# Patient Record
Sex: Male | Born: 1960
Health system: Southern US, Community
[De-identification: ages and names within clinical notes are randomized; demographics above are authoritative.]

## PROBLEM LIST (undated history)

## (undated) DIAGNOSIS — M1990 Unspecified osteoarthritis, unspecified site: Secondary | ICD-10-CM

## (undated) DIAGNOSIS — G4730 Sleep apnea, unspecified: Secondary | ICD-10-CM

## (undated) DIAGNOSIS — Z9889 Other specified postprocedural states: Secondary | ICD-10-CM

## (undated) DIAGNOSIS — D649 Anemia, unspecified: Secondary | ICD-10-CM

## (undated) DIAGNOSIS — R112 Nausea with vomiting, unspecified: Secondary | ICD-10-CM

## (undated) HISTORY — PX: SEPTOPLASTY: SUR1290

## (undated) HISTORY — PX: BACK SURGERY: SHX140

## (undated) HISTORY — PX: TONSILLECTOMY: SUR1361

## (undated) HISTORY — PX: JOINT REPLACEMENT: SHX530

## (undated) HISTORY — PX: OTHER SURGICAL HISTORY: SHX169

---

## 2008-06-25 DIAGNOSIS — E559 Vitamin D deficiency, unspecified: Secondary | ICD-10-CM | POA: Insufficient documentation

## 2008-06-25 DIAGNOSIS — J209 Acute bronchitis, unspecified: Secondary | ICD-10-CM | POA: Insufficient documentation

## 2009-11-26 DIAGNOSIS — L255 Unspecified contact dermatitis due to plants, except food: Secondary | ICD-10-CM | POA: Insufficient documentation

## 2009-12-09 ENCOUNTER — Emergency Department: Payer: Self-pay | Admitting: Emergency Medicine

## 2011-06-12 ENCOUNTER — Encounter (HOSPITAL_COMMUNITY): Payer: Self-pay | Admitting: Pharmacy Technician

## 2011-06-16 ENCOUNTER — Encounter (HOSPITAL_COMMUNITY)
Admission: RE | Admit: 2011-06-16 | Discharge: 2011-06-16 | Disposition: A | Discharge status: Home / Self Care | Payer: BC Managed Care – PPO | Source: Ambulatory Visit | Attending: Orthopedic Surgery | Admitting: Orthopedic Surgery

## 2011-06-16 ENCOUNTER — Encounter (HOSPITAL_COMMUNITY): Payer: Self-pay

## 2011-06-16 HISTORY — DX: Other specified postprocedural states: Z98.890

## 2011-06-16 HISTORY — DX: Nausea with vomiting, unspecified: R11.2

## 2011-06-16 HISTORY — DX: Unspecified osteoarthritis, unspecified site: M19.90

## 2011-06-16 LAB — CBC
MCH: 32.1 pg (ref 26.0–34.0)
MCV: 93.2 fL (ref 78.0–100.0)
Platelets: 208 10*3/uL (ref 150–400)
RDW: 13.5 % (ref 11.5–15.5)

## 2011-06-16 LAB — URINALYSIS, ROUTINE W REFLEX MICROSCOPIC
Bilirubin Urine: NEGATIVE
Hgb urine dipstick: NEGATIVE
Protein, ur: NEGATIVE mg/dL
Specific Gravity, Urine: 1.028 (ref 1.005–1.030)
Urobilinogen, UA: 0.2 mg/dL (ref 0.0–1.0)

## 2011-06-16 LAB — DIFFERENTIAL
Basophils Absolute: 0 10*3/uL (ref 0.0–0.1)
Eosinophils Absolute: 0.2 10*3/uL (ref 0.0–0.7)
Eosinophils Relative: 2 % (ref 0–5)

## 2011-06-16 LAB — BASIC METABOLIC PANEL
CO2: 24 mEq/L (ref 19–32)
Calcium: 9.9 mg/dL (ref 8.4–10.5)
Creatinine, Ser: 0.93 mg/dL (ref 0.50–1.35)
Glucose, Bld: 111 mg/dL — ABNORMAL HIGH (ref 70–99)

## 2011-06-16 LAB — PROTIME-INR: Prothrombin Time: 12.6 seconds (ref 11.6–15.2)

## 2011-06-16 LAB — SURGICAL PCR SCREEN: Staphylococcus aureus: NEGATIVE

## 2011-06-16 NOTE — Pre-Procedure Instructions (Signed)
Talked to Presley Raddle, RTR at Dr. Nilsa Nutting office to inform him he needs to look at pt's BMET results in EPIC.

## 2011-06-16 NOTE — Patient Instructions (Signed)
20 Walter Kim  06/16/2011   Your procedure is scheduled on:  Tuesday 06/23/2011 at 1200pm  Report to [facility] at 1000 AM.  Call this number if you have problems the morning of surgery: [phone]   Remember:   Do not eat food:After Midnight.  May have clear liquids:until Midnight .  Clear liquids include soda, tea, black coffee, apple or grape juice, broth.  Take these medicines the morning of surgery with A SIP OF WATER: NONE   Do not wear jewelry, make-up or nail polish.  Do not wear lotions, powders, or perfumes.  Do not shave 48 hours prior to surgery.(women only-shaving legs)  Do not bring valuables to the hospital.  Contacts, dentures or bridgework may not be worn into surgery.  Leave suitcase in the car. After surgery it may be brought to your room.  For patients admitted to the hospital, checkout time is 11:00 AM the day of discharge.   Patients discharged the day of surgery will not be allowed to drive home.  Name and phone number of your driver:   Special Instructions: CHG Shower Use Special Wash: 1/2 bottle night before surgery and 1/2 bottle morning of surgery.   Please read over the following fact sheets that you were given: MRSA Information

## 2011-06-21 NOTE — H&P (Signed)
Walter Kim is an 51 y.o. male.    Chief Complaint: left hip OA and pain   HPI: Pt is a 51 y.o. male complaining of left hip pain for over 1.5 years. Pain had continually increased since the beginning.  Originally thought the pain was from the knees, which have arthritis, but eventually the left hip was examined and determined to be a cause of most of his pain. X-rays in the clinic show end-stage arthritic changes of the left hip. Pt has tried various conservative treatments which have failed to alleviate their symptoms, including an 22222intraarticular injection. Various options are discussed with the patient. Risks, benefits and expectations were discussed with the patient. Patient understand the risks, benefits and expectations and wishes to proceed with surgery.   PCP:  No primary provider on file.  D/C Plans:  Home with HHPT  Post-op Meds:   Rx given for ASA, Robaxin, Celebrex, Iron, Colace and MiraLax  Tranexamic Acid:  To be given  Decadron:  To be given   PMH: Past Medical History  Diagnosis Date  . PONV (postoperative nausea and vomiting)     after back surgery  . Arthritis     PSH: Past Surgical History  Procedure Date  . Back surgery     20 yrs. ago  . Tonsillectomy     as child  . Right knee arthroscopy 4 yrs. ago    Social History:  does not have a smoking history on file. He does not have any smokeless tobacco history on file. He reports that he drinks about 1.2 ounces of alcohol per week. He reports that he does not use illicit drugs.  Allergies:  No Known Allergies  Medications: No current facility-administered medications for this encounter.   Current Outpatient Prescriptions  Medication Sig Dispense Refill  . cholecalciferol (VITAMIN D) 1000 UNITS tablet Take 1,000 Units by mouth once a week.      Marland Kitchen ibuprofen (ADVIL,MOTRIN) 200 MG tablet Take 800 mg by mouth every 6 (six) hours as needed. Pain         ROS: Review of Systems  Constitutional:  Negative.   HENT: Negative.   Eyes: Negative.   Respiratory: Negative.   Cardiovascular: Negative.   Gastrointestinal: Negative.   Genitourinary: Negative.   Musculoskeletal: Positive for myalgias, back pain and joint pain.  Skin: Negative.   Neurological: Negative.   Endo/Heme/Allergies: Negative.   Psychiatric/Behavioral: Negative.      Physican Exam: Physical Exam  Constitutional: He is oriented to person, place, and time and well-developed, well-nourished, and in no distress.  HENT:  Head: Normocephalic and atraumatic.  Nose: Nose normal.  Mouth/Throat: Oropharynx is clear and moist.  Eyes: Pupils are equal, round, and reactive to light.  Neck: Neck supple. No JVD present. No tracheal deviation present. No thyromegaly present.  Cardiovascular: Normal rate, regular rhythm, normal heart sounds and intact distal pulses.   Pulmonary/Chest: Effort normal and breath sounds normal. No stridor.  Abdominal: Soft. There is no tenderness. There is no guarding.  Musculoskeletal:       Left hip: He exhibits decreased range of motion (all ROM causes pain), decreased strength, tenderness and bony tenderness. He exhibits no swelling, no crepitus, no deformity and no laceration.  Lymphadenopathy:    He has no cervical adenopathy.  Neurological: He is alert and oriented to person, place, and time.  Skin: Skin is warm and dry.  Psychiatric: Affect normal.     Assessment/Plan Assessment: left hip OA and pain  Plan: Patient will undergo a left total hip arthroplasty, anterior approach on 06/23/2011. Risks benefits and expectation were discussed with the patient. Patient understand risks, benefits and expectation and wishes to proceed.   Anastasio Auerbach Shelby Anderle   PAC  06/21/2011, 9:04 PM

## 2011-06-23 ENCOUNTER — Encounter (HOSPITAL_COMMUNITY)
Admission: RE | Disposition: A | Discharge status: Home Health | Payer: Self-pay | Source: Ambulatory Visit | Attending: Orthopedic Surgery

## 2011-06-23 ENCOUNTER — Encounter (HOSPITAL_COMMUNITY): Payer: Self-pay | Admitting: Anesthesiology

## 2011-06-23 ENCOUNTER — Inpatient Hospital Stay (HOSPITAL_COMMUNITY): Payer: BC Managed Care – PPO

## 2011-06-23 ENCOUNTER — Encounter (HOSPITAL_COMMUNITY): Payer: Self-pay | Admitting: *Deleted

## 2011-06-23 ENCOUNTER — Encounter (HOSPITAL_COMMUNITY): Payer: Self-pay

## 2011-06-23 ENCOUNTER — Inpatient Hospital Stay (HOSPITAL_COMMUNITY): Payer: BC Managed Care – PPO | Admitting: Anesthesiology

## 2011-06-23 ENCOUNTER — Inpatient Hospital Stay (HOSPITAL_COMMUNITY)
Admission: RE | Admit: 2011-06-23 | Discharge: 2011-06-25 | DRG: 818 | Disposition: A | Discharge status: Home Health | Payer: BC Managed Care – PPO | Source: Ambulatory Visit | Attending: Orthopedic Surgery | Admitting: Orthopedic Surgery

## 2011-06-23 DIAGNOSIS — Z96649 Presence of unspecified artificial hip joint: Secondary | ICD-10-CM

## 2011-06-23 DIAGNOSIS — M169 Osteoarthritis of hip, unspecified: Principal | ICD-10-CM | POA: Diagnosis present

## 2011-06-23 DIAGNOSIS — M1610 Unilateral primary osteoarthritis, unspecified hip: Principal | ICD-10-CM | POA: Diagnosis present

## 2011-06-23 DIAGNOSIS — Z01812 Encounter for preprocedural laboratory examination: Secondary | ICD-10-CM

## 2011-06-23 DIAGNOSIS — R112 Nausea with vomiting, unspecified: Secondary | ICD-10-CM | POA: Diagnosis not present

## 2011-06-23 HISTORY — PX: TOTAL HIP ARTHROPLASTY: SHX124

## 2011-06-23 LAB — ABO/RH: ABO/RH(D): O POS

## 2011-06-23 LAB — TYPE AND SCREEN
ABO/RH(D): O POS
Antibody Screen: NEGATIVE

## 2011-06-23 SURGERY — ARTHROPLASTY, HIP, TOTAL, ANTERIOR APPROACH
Anesthesia: General | Site: Hip | Laterality: Left | Wound class: Clean

## 2011-06-23 MED ORDER — HYDROMORPHONE HCL PF 1 MG/ML IJ SOLN
INTRAMUSCULAR | Status: DC | PRN
  Administered 2011-06-23 (×3): 0.5 mg via INTRAVENOUS

## 2011-06-23 MED ORDER — LIDOCAINE HCL (CARDIAC) 20 MG/ML IV SOLN
INTRAVENOUS | Status: DC | PRN
  Administered 2011-06-23: 80 mg via INTRAVENOUS

## 2011-06-23 MED ORDER — METOCLOPRAMIDE HCL 5 MG PO TABS
5.0000 mg | ORAL_TABLET | Freq: Three times a day (TID) | ORAL | Status: DC | PRN
  Filled 2011-06-23: qty 2

## 2011-06-23 MED ORDER — ONDANSETRON HCL 4 MG/2ML IJ SOLN
4.0000 mg | Freq: Four times a day (QID) | INTRAMUSCULAR | Status: DC | PRN
  Administered 2011-06-23 – 2011-06-24 (×3): 4 mg via INTRAVENOUS
  Filled 2011-06-23 (×3): qty 2

## 2011-06-23 MED ORDER — MENTHOL 3 MG MT LOZG
1.0000 | LOZENGE | OROMUCOSAL | Status: DC | PRN
  Filled 2011-06-23: qty 9

## 2011-06-23 MED ORDER — DOCUSATE SODIUM 100 MG PO CAPS
100.0000 mg | ORAL_CAPSULE | Freq: Two times a day (BID) | ORAL | Status: DC
  Administered 2011-06-23 – 2011-06-24 (×3): 100 mg via ORAL
  Filled 2011-06-23 (×4): qty 1

## 2011-06-23 MED ORDER — GLYCOPYRROLATE 0.2 MG/ML IJ SOLN
INTRAMUSCULAR | Status: DC | PRN
  Administered 2011-06-23: .8 mg via INTRAVENOUS

## 2011-06-23 MED ORDER — METHOCARBAMOL 500 MG PO TABS
500.0000 mg | ORAL_TABLET | Freq: Four times a day (QID) | ORAL | Status: DC | PRN
  Administered 2011-06-24 – 2011-06-25 (×3): 500 mg via ORAL
  Filled 2011-06-23 (×3): qty 1

## 2011-06-23 MED ORDER — MIDAZOLAM HCL 5 MG/5ML IJ SOLN
INTRAMUSCULAR | Status: DC | PRN
  Administered 2011-06-23: 2 mg via INTRAVENOUS

## 2011-06-23 MED ORDER — ZOLPIDEM TARTRATE 5 MG PO TABS
5.0000 mg | ORAL_TABLET | Freq: Every evening | ORAL | Status: DC | PRN
  Administered 2011-06-24: 5 mg via ORAL
  Filled 2011-06-23 (×2): qty 1

## 2011-06-23 MED ORDER — LACTATED RINGERS IV SOLN: INTRAVENOUS | Status: DC

## 2011-06-23 MED ORDER — CEFAZOLIN SODIUM-DEXTROSE 2-3 GM-% IV SOLR
2.0000 g | Freq: Four times a day (QID) | INTRAVENOUS | Status: AC
  Administered 2011-06-23 – 2011-06-24 (×3): 2 g via INTRAVENOUS
  Filled 2011-06-23 (×3): qty 50

## 2011-06-23 MED ORDER — PHENOL 1.4 % MT LIQD
1.0000 | OROMUCOSAL | Status: DC | PRN
  Filled 2011-06-23: qty 177

## 2011-06-23 MED ORDER — DEXAMETHASONE SODIUM PHOSPHATE 10 MG/ML IJ SOLN: 10.0000 mg | Freq: Once | INTRAMUSCULAR | Status: DC

## 2011-06-23 MED ORDER — FENTANYL CITRATE 0.05 MG/ML IJ SOLN
INTRAMUSCULAR | Status: DC | PRN
  Administered 2011-06-23 (×5): 50 ug via INTRAVENOUS

## 2011-06-23 MED ORDER — NEOSTIGMINE METHYLSULFATE 1 MG/ML IJ SOLN
INTRAMUSCULAR | Status: DC | PRN
  Administered 2011-06-23: 5 mg via INTRAVENOUS

## 2011-06-23 MED ORDER — HYDROMORPHONE HCL PF 1 MG/ML IJ SOLN
0.5000 mg | INTRAMUSCULAR | Status: DC | PRN
  Administered 2011-06-23: 1 mg via INTRAVENOUS
  Filled 2011-06-23 (×2): qty 1

## 2011-06-23 MED ORDER — ROCURONIUM BROMIDE 100 MG/10ML IV SOLN
INTRAVENOUS | Status: DC | PRN
  Administered 2011-06-23: 5 mg via INTRAVENOUS
  Administered 2011-06-23: 20 mg via INTRAVENOUS
  Administered 2011-06-23: 40 mg via INTRAVENOUS

## 2011-06-23 MED ORDER — ONDANSETRON HCL 4 MG/2ML IJ SOLN
INTRAMUSCULAR | Status: DC | PRN
  Administered 2011-06-23: 4 mg via INTRAVENOUS

## 2011-06-23 MED ORDER — ACETAMINOPHEN 10 MG/ML IV SOLN
INTRAVENOUS | Status: DC | PRN
  Administered 2011-06-23: 1000 mg via INTRAVENOUS

## 2011-06-23 MED ORDER — LACTATED RINGERS IV SOLN
INTRAVENOUS | Status: DC
  Administered 2011-06-23: 1000 mL via INTRAVENOUS

## 2011-06-23 MED ORDER — 0.9 % SODIUM CHLORIDE (POUR BTL) OPTIME
TOPICAL | Status: DC | PRN
  Administered 2011-06-23: 1000 mL

## 2011-06-23 MED ORDER — HYDROMORPHONE HCL PF 1 MG/ML IJ SOLN
INTRAMUSCULAR | Status: AC
  Administered 2011-06-23: 1 mg via INTRAVENOUS
  Filled 2011-06-23: qty 1

## 2011-06-23 MED ORDER — ONDANSETRON HCL 4 MG PO TABS: 4.0000 mg | ORAL_TABLET | Freq: Four times a day (QID) | ORAL | Status: DC | PRN

## 2011-06-23 MED ORDER — FERROUS SULFATE 325 (65 FE) MG PO TABS
325.0000 mg | ORAL_TABLET | Freq: Three times a day (TID) | ORAL | Status: DC
  Administered 2011-06-23 – 2011-06-24 (×4): 325 mg via ORAL
  Filled 2011-06-23 (×5): qty 1

## 2011-06-23 MED ORDER — ALUM & MAG HYDROXIDE-SIMETH 200-200-20 MG/5ML PO SUSP: 30.0000 mL | ORAL | Status: DC | PRN

## 2011-06-23 MED ORDER — PROPOFOL 10 MG/ML IV EMUL
INTRAVENOUS | Status: DC | PRN
  Administered 2011-06-23: 200 mg via INTRAVENOUS

## 2011-06-23 MED ORDER — TRANEXAMIC ACID 100 MG/ML IV SOLN
15.0000 mg/kg | Freq: Once | INTRAVENOUS | Status: AC
  Administered 2011-06-23: 1102.5 mg via INTRAVENOUS
  Filled 2011-06-23: qty 11.03

## 2011-06-23 MED ORDER — DIPHENHYDRAMINE HCL 25 MG PO CAPS: 25.0000 mg | ORAL_CAPSULE | Freq: Four times a day (QID) | ORAL | Status: DC | PRN

## 2011-06-23 MED ORDER — METOCLOPRAMIDE HCL 5 MG/ML IJ SOLN
5.0000 mg | Freq: Three times a day (TID) | INTRAMUSCULAR | Status: DC | PRN
  Administered 2011-06-24: 10 mg via INTRAVENOUS
  Filled 2011-06-23: qty 2

## 2011-06-23 MED ORDER — DEXAMETHASONE SODIUM PHOSPHATE 10 MG/ML IJ SOLN
INTRAMUSCULAR | Status: DC | PRN
  Administered 2011-06-23: 10 mg via INTRAVENOUS

## 2011-06-23 MED ORDER — RIVAROXABAN 10 MG PO TABS
10.0000 mg | ORAL_TABLET | ORAL | Status: DC
  Administered 2011-06-24 – 2011-06-25 (×2): 10 mg via ORAL
  Filled 2011-06-23 (×2): qty 1

## 2011-06-23 MED ORDER — DEXAMETHASONE SODIUM PHOSPHATE 10 MG/ML IJ SOLN
10.0000 mg | Freq: Once | INTRAMUSCULAR | Status: AC
  Administered 2011-06-24: 10 mg via INTRAVENOUS
  Filled 2011-06-23: qty 1

## 2011-06-23 MED ORDER — HYDROMORPHONE HCL PF 1 MG/ML IJ SOLN
0.2500 mg | INTRAMUSCULAR | Status: DC | PRN
Start: 2011-06-23 — End: 2011-06-23
  Administered 2011-06-23 (×4): 0.5 mg via INTRAVENOUS

## 2011-06-23 MED ORDER — BISACODYL 5 MG PO TBEC: 5.0000 mg | DELAYED_RELEASE_TABLET | Freq: Every day | ORAL | Status: DC | PRN

## 2011-06-23 MED ORDER — POLYETHYLENE GLYCOL 3350 17 G PO PACK
17.0000 g | PACK | Freq: Two times a day (BID) | ORAL | Status: DC
  Administered 2011-06-23 – 2011-06-24 (×3): 17 g via ORAL
  Filled 2011-06-23 (×3): qty 1

## 2011-06-23 MED ORDER — HYDROCODONE-ACETAMINOPHEN 5-325 MG PO TABS
1.5000 | ORAL_TABLET | ORAL | Status: DC
  Administered 2011-06-23: 3 via ORAL
  Administered 2011-06-23: 2 via ORAL
  Filled 2011-06-23: qty 3
  Filled 2011-06-23: qty 2

## 2011-06-23 MED ORDER — SODIUM CHLORIDE 0.9 % IV SOLN
100.0000 mL/h | INTRAVENOUS | Status: DC
  Administered 2011-06-23 – 2011-06-24 (×3): 100 mL/h via INTRAVENOUS
  Filled 2011-06-23 (×10): qty 1000

## 2011-06-23 MED ORDER — METHOCARBAMOL 100 MG/ML IJ SOLN
500.0000 mg | Freq: Four times a day (QID) | INTRAVENOUS | Status: DC | PRN
  Administered 2011-06-23: 500 mg via INTRAVENOUS
  Filled 2011-06-23: qty 5

## 2011-06-23 MED ORDER — FLEET ENEMA 7-19 GM/118ML RE ENEM: 1.0000 | ENEMA | Freq: Once | RECTAL | Status: AC | PRN

## 2011-06-23 MED ORDER — CEFAZOLIN SODIUM 1-5 GM-% IV SOLN
1.0000 g | INTRAVENOUS | Status: AC
  Administered 2011-06-23 (×2): 1 g via INTRAVENOUS

## 2011-06-23 SURGICAL SUPPLY — 39 items
BAG ZIPLOCK 12X15 (MISCELLANEOUS) ×2 IMPLANT
BLADE SAW SGTL 18X1.27X75 (BLADE) ×2 IMPLANT
CELLS DAT CNTRL 66122 CELL SVR (MISCELLANEOUS) IMPLANT
CLOTH BEACON ORANGE TIMEOUT ST (SAFETY) ×2 IMPLANT
DERMABOND ADVANCED (GAUZE/BANDAGES/DRESSINGS) ×1
DERMABOND ADVANCED .7 DNX12 (GAUZE/BANDAGES/DRESSINGS) ×1 IMPLANT
DRAPE C-ARM 42X72 X-RAY (DRAPES) ×2 IMPLANT
DRAPE STERI IOBAN 125X83 (DRAPES) ×2 IMPLANT
DRAPE U-SHAPE 47X51 STRL (DRAPES) ×6 IMPLANT
DRSG AQUACEL AG ADV 3.5X10 (GAUZE/BANDAGES/DRESSINGS) ×2 IMPLANT
DRSG TEGADERM 4X4.75 (GAUZE/BANDAGES/DRESSINGS) ×2 IMPLANT
DURAPREP 26ML APPLICATOR (WOUND CARE) ×2 IMPLANT
ELECT BLADE TIP CTD 4 INCH (ELECTRODE) ×2 IMPLANT
ELECT REM PT RETURN 9FT ADLT (ELECTROSURGICAL) ×2
ELECTRODE REM PT RTRN 9FT ADLT (ELECTROSURGICAL) ×1 IMPLANT
EVACUATOR 1/8 PVC DRAIN (DRAIN) ×2 IMPLANT
FACESHIELD LNG OPTICON STERILE (SAFETY) ×8 IMPLANT
GAUZE SPONGE 2X2 8PLY STRL LF (GAUZE/BANDAGES/DRESSINGS) ×1 IMPLANT
GLOVE BIOGEL PI IND STRL 7.5 (GLOVE) ×1 IMPLANT
GLOVE BIOGEL PI IND STRL 8 (GLOVE) ×1 IMPLANT
GLOVE BIOGEL PI INDICATOR 7.5 (GLOVE) ×1
GLOVE BIOGEL PI INDICATOR 8 (GLOVE) ×1
GLOVE ECLIPSE 8.0 STRL XLNG CF (GLOVE) ×2 IMPLANT
GLOVE ORTHO TXT STRL SZ7.5 (GLOVE) ×4 IMPLANT
GOWN BRE IMP PREV XXLGXLNG (GOWN DISPOSABLE) ×2 IMPLANT
GOWN STRL NON-REIN LRG LVL3 (GOWN DISPOSABLE) ×2 IMPLANT
KIT BASIN OR (CUSTOM PROCEDURE TRAY) ×2 IMPLANT
PACK TOTAL JOINT (CUSTOM PROCEDURE TRAY) ×2 IMPLANT
PADDING CAST COTTON 6X4 STRL (CAST SUPPLIES) ×2 IMPLANT
RTRCTR WOUND ALEXIS 18CM MED (MISCELLANEOUS)
SPONGE GAUZE 2X2 STER 10/PKG (GAUZE/BANDAGES/DRESSINGS) ×1
SUCTION FRAZIER 12FR DISP (SUCTIONS) ×2 IMPLANT
SUT MNCRL AB 4-0 PS2 18 (SUTURE) ×2 IMPLANT
SUT VIC AB 1 CT1 36 (SUTURE) ×6 IMPLANT
SUT VIC AB 2-0 CT1 27 (SUTURE) ×2
SUT VIC AB 2-0 CT1 TAPERPNT 27 (SUTURE) ×2 IMPLANT
SUT VLOC 180 0 24IN GS25 (SUTURE) ×2 IMPLANT
TOWEL OR 17X26 10 PK STRL BLUE (TOWEL DISPOSABLE) ×4 IMPLANT
TRAY FOLEY CATH 14FRSI W/METER (CATHETERS) ×2 IMPLANT

## 2011-06-23 NOTE — Progress Notes (Signed)
Portable ap pelvis and lateral left hip x-rays done 

## 2011-06-23 NOTE — Anesthesia Postprocedure Evaluation (Signed)
  Anesthesia Post-op Note  Patient: Walter Kim  Procedure(s) Performed: Procedure(s) (LRB): TOTAL HIP ARTHROPLASTY ANTERIOR APPROACH (Left)  Patient Location: PACU  Anesthesia Type: General  Level of Consciousness: awake and alert   Airway and Oxygen Therapy: Patient Spontanous Breathing  Post-op Pain: mild  Post-op Assessment: Post-op Vital signs reviewed, Patient's Cardiovascular Status Stable, Respiratory Function Stable, Patent Airway and No signs of Nausea or vomiting  Post-op Vital Signs: stable  Complications: No apparent anesthesia complications

## 2011-06-23 NOTE — Anesthesia Preprocedure Evaluation (Addendum)
Anesthesia Evaluation  Patient identified by MRN, date of birth, ID band Patient awake    Reviewed: Allergy & Precautions, H&P , NPO status , Patient's Chart, lab work & pertinent test results  History of Anesthesia Complications (+) PONV and DIFFICULT AIRWAY  Airway Mallampati: II TM Distance: >3 FB Neck ROM: full    Dental No notable dental hx.    Pulmonary neg pulmonary ROS,  breath sounds clear to auscultation  Pulmonary exam normal       Cardiovascular Exercise Tolerance: Good negative cardio ROS  Rhythm:regular Rate:Normal     Neuro/Psych negative neurological ROS  negative psych ROS   GI/Hepatic negative GI ROS, Neg liver ROS,   Endo/Other  negative endocrine ROS  Renal/GU negative Renal ROS  negative genitourinary   Musculoskeletal   Abdominal   Peds  Hematology negative hematology ROS (+)   Anesthesia Other Findings   Reproductive/Obstetrics negative OB ROS                          Anesthesia Physical Anesthesia Plan  ASA: I  Anesthesia Plan: General   Post-op Pain Management:    Induction: Intravenous  Airway Management Planned: Oral ETT  Additional Equipment:   Intra-op Plan:   Post-operative Plan:   Informed Consent: I have reviewed the patients History and Physical, chart, labs and discussed the procedure including the risks, benefits and alternatives for the proposed anesthesia with the patient or authorized representative who has indicated his/her understanding and acceptance.   Dental Advisory Given  Plan Discussed with: CRNA and Surgeon  Anesthesia Plan Comments:         Anesthesia Quick Evaluation

## 2011-06-23 NOTE — Progress Notes (Signed)
X-ray results noted after talking with x-ray department.

## 2011-06-23 NOTE — Interval H&P Note (Signed)
History and Physical Interval Note:  06/23/2011 10:13 AM  Walter Kim  has presented today for surgery, with the diagnosis of left hip osteoarthritis   The various methods of treatment have been discussed with the patient and family. After consideration of risks, benefits and other options for treatment, the patient has consented to  Procedure(s) (LRB): LEFT TOTAL HIP ARTHROPLASTY ANTERIOR APPROACH (Left) as a surgical intervention .  The patients' history has been reviewed, patient examined, no change in status, stable for surgery.  I have reviewed the patients' chart and labs.  Questions were answered to the patient's satisfaction.     [name]

## 2011-06-23 NOTE — Preoperative (Signed)
Beta Blockers   Reason not to administer Beta Blockers:Not Applicable 

## 2011-06-23 NOTE — Transfer of Care (Signed)
Immediate Anesthesia Transfer of Care Note  Patient: Walter Kim  Procedure(s) Performed: Procedure(s) (LRB): TOTAL HIP ARTHROPLASTY ANTERIOR APPROACH (Left)  Patient Location: PACU  Anesthesia Type: General  Level of Consciousness: awake, alert , oriented and patient cooperative  Airway & Oxygen Therapy: Patient Spontanous Breathing and Patient connected to face mask oxygen  Post-op Assessment: Report given to PACU RN, Post -op Vital signs reviewed and stable and Patient moving all extremities  Post vital signs: Reviewed and stable  Complications: No apparent anesthesia complications

## 2011-06-23 NOTE — Op Note (Signed)
NAME:  Walter Kim                ACCOUNT NO.: [id number]      MEDICAL RECORD NO.: [id number]      FACILITY:  [facility]      PHYSICIAN:  Durene Romans D  DATE OF BIRTH:  October 09, 1960     DATE OF PROCEDURE:  06/23/2011                                 OPERATIVE REPORT         PREOPERATIVE DIAGNOSIS: Left  hip osteoarthritis.      POSTOPERATIVE DIAGNOSIS:  Left hip osteoarthritis.      PROCEDURE:  Left total hip replacement through an anterior approach   utilizing DePuy THR system, component size 54mm pinnacle cup, a size 36+4 neutral   Altrex liner, a size 6Hi Tri Lock stem with a 36+5 delta ceramic   ball.      SURGEON:  Madlyn Frankel. Charlann Boxer, M.D.      ASSISTANT:  Lanney Gins, PA      ANESTHESIA:  General.      SPECIMENS:  None.      COMPLICATIONS:  None.      BLOOD LOSS:  600 cc     DRAINS:  One Hemovac.      INDICATION OF THE PROCEDURE:  Walter Kim is a 51 y.o. male who had   presented to office for evaluation of left hip pain.  Radiographs revealed   progressive degenerative changes with bone-on-bone   articulation to the  hip joint.  The patient had painful limited range of   motion significantly affecting their overall quality of life.  The patient was failing to    respond to conservative measures, and at this point was ready   to proceed with more definitive measures.  The patient has noted progressive   degenerative changes in his hip, progressive problems and dysfunction   with regarding the hip prior to surgery.  Consent was obtained for   benefit of pain relief.  Specific risk of infection, DVT, component   failure, dislocation, need for revision surgery, as well discussion of   the anterior versus posterior approach were reviewed.  Consent was   obtained for benefit of anterior pain relief through an anterior   approach.      PROCEDURE IN DETAIL:  The patient was brought to operative theater.   Once adequate anesthesia, preoperative antibiotics, 2gm Ancef  administered.   The patient was positioned supine on the OSI Hanna table.  Once adequate   padding of boney process was carried out, we had predraped out the hip, and  used fluoroscopy to confirm orientation of the pelvis and position.      The right hip was then prepped and draped from proximal iliac crest to   mid thigh with shower curtain technique.      Time-out was performed identifying the patient, planned procedure, and   extremity.     An incision was then made 2 cm distal and lateral to the   anterior superior iliac spine extending over the orientation of the   tensor fascia lata muscle and sharp dissection was carried down to the   fascia of the muscle and protractor placed in the soft tissues.      The fascia was then incised.  The muscle belly was identified and swept   laterally  and retractor placed along the superior neck.  Following   cauterization of the circumflex vessels and removing some pericapsular   fat, a second cobra retractor was placed on the inferior neck.  A third   retractor was placed on the anterior acetabulum after elevating the   anterior rectus.  A L-capsulotomy was along the line of the   superior neck to the trochanteric fossa, then extended proximally and   distally.  Tag sutures were placed and the retractors were then placed   intracapsular.  We then identified the trochanteric fossa and   orientation of my neck cut, confirmed this radiographically   and then made a neck osteotomy with the femur on traction.  The femoral   head was removed without difficulty or complication.  Traction was let   off and retractors were placed posterior and anterior around the   acetabulum.      The labrum and foveal tissue were debrided.  I began reaming with a 47mm   reamer and reamed up to 53mm reamer with good bony bed preparation and a 54   cup was chosen.  The final 54mm Pinnacle cup was then impacted under fluoroscopy  to confirm the depth of penetration  and orientation with respect to   abduction.  A screw was placed followed by the hole eliminator.  The final   36+4 Altrex liner was impacted with good visualized rim fit.  The cup was positioned anatomically within the acetabular portion of the pelvis.      At this point, the femur was rolled at 80 degrees.  Further capsule was   released off the inferior aspect of the femoral neck.  I then   released the superior capsule proximally.  The hook was placed laterally   along the femur and elevated manually and held in position with the bed   hook.  The leg was then extended and adducted with the leg rolled to 100   degrees of external rotation.  Once the proximal femur was fully   exposed, I used a box osteotome to set orientation.  I then began   broaching with the starting chili pepper broach and passed this by hand and then broached up to 6.  With the 6 broach in place I chose a highoffset neck with a 36+1.5 and did a trial reduction.  The offset was appropriate, leg lengths   appeared to be equal, confirmed radiographically.   Given these findings, I went ahead and dislocated the hip, repositioned all   retractors and positioned the right hip in the extended and abducted position.  The final 6 Hi  Tri Lock stem was   chosen and it was impacted down to the level of neck cut.  Based on this   and a repeat trial reduction, a 36+5 delta ceramic ball was chosen and   impacted onto a clean and dry trunnion, and the hip was reduced.  The   hip had been irrigated throughout the case again at this point.  I did   reapproximate the superior capsular leaflet to the anterior leaflet   using #1 Vicryl, placed a medium Hemovac drain deep.  The fascia of the   tensor fascia lata muscle was then reapproximated using #1 Vicryl.  The   remaining wound was closed with 2-0 Vicryl and running 4-0 Monocryl.   The hip was cleaned, dried, and dressed sterilely using Dermabond and   Aquacel dressing.  Drain site  dressed separately.  She was then brought   to recovery room in stable condition tolerating the procedure well.    Danae Orleans, PA-C was present for the entirety of the case involved from   preoperative positioning, perioperative retractor management, general   facilitation of the case, as well as primary wound closure as assistant.            Pietro Cassis Alvan Dame, M.D.            MDO/MEDQ  D:  02/10/2011  T:  02/10/2011  Job:  [id number]      Electronically Signed by Paralee Cancel M.D. on 02/16/2011 09:15:38 AM

## 2011-06-24 LAB — BASIC METABOLIC PANEL
BUN: 15 mg/dL (ref 6–23)
GFR calc non Af Amer: >90 mL/min (ref >90)
Glucose, Bld: 141 mg/dL — ABNORMAL HIGH (ref 70–99)
Potassium: 4.6 mEq/L (ref 3.5–5.1)

## 2011-06-24 LAB — CBC
HCT: 39.6 % (ref 39.0–52.0)
Hemoglobin: 13.4 g/dL (ref 13.0–17.0)
MCHC: 33.8 g/dL (ref 30.0–36.0)

## 2011-06-24 MED ORDER — HYDROCODONE-ACETAMINOPHEN 5-325 MG PO TABS
1.5000 | ORAL_TABLET | ORAL | Status: DC
  Administered 2011-06-24: 2 via ORAL
  Administered 2011-06-24 (×2): 1 via ORAL
  Administered 2011-06-24 – 2011-06-25 (×4): 2 via ORAL
  Filled 2011-06-24: qty 2
  Filled 2011-06-24: qty 1
  Filled 2011-06-24: qty 2
  Filled 2011-06-24 (×2): qty 1
  Filled 2011-06-24 (×2): qty 2
  Filled 2011-06-24: qty 1

## 2011-06-24 MED ORDER — DSS 100 MG PO CAPS: 100.0000 mg | ORAL_CAPSULE | Freq: Two times a day (BID) | ORAL | Status: AC

## 2011-06-24 MED ORDER — ASPIRIN EC 325 MG PO TBEC: 325.0000 mg | DELAYED_RELEASE_TABLET | Freq: Two times a day (BID) | ORAL | Status: AC

## 2011-06-24 MED ORDER — FERROUS SULFATE 325 (65 FE) MG PO TABS: 325.0000 mg | ORAL_TABLET | Freq: Three times a day (TID) | ORAL | Status: DC

## 2011-06-24 MED ORDER — HYDROCODONE-ACETAMINOPHEN 5-325 MG PO TABS: 1.5000 | ORAL_TABLET | ORAL | Status: AC

## 2011-06-24 MED ORDER — METHOCARBAMOL 500 MG PO TABS: 500.0000 mg | ORAL_TABLET | Freq: Four times a day (QID) | ORAL | Status: AC | PRN

## 2011-06-24 MED ORDER — DIPHENHYDRAMINE HCL 25 MG PO CAPS: 25.0000 mg | ORAL_CAPSULE | Freq: Four times a day (QID) | ORAL | Status: DC | PRN

## 2011-06-24 MED ORDER — CELECOXIB 200 MG PO CAPS: 200.0000 mg | ORAL_CAPSULE | Freq: Two times a day (BID) | ORAL | Status: AC

## 2011-06-24 MED ORDER — POLYETHYLENE GLYCOL 3350 17 G PO PACK: 17.0000 g | PACK | Freq: Two times a day (BID) | ORAL | Status: AC

## 2011-06-24 NOTE — Progress Notes (Signed)
Subjective: 1 Day Post-Op Procedure(s) (LRB): TOTAL HIP ARTHROPLASTY ANTERIOR APPROACH (Left)   Patient reports pain as mild. Pain well controlled. No events throughout the night.  Objective:   VITALS:   Filed Vitals:   06/24/11 0500  BP: 124/82  Pulse: 63  Temp: 98.2 F (36.8 C)  Resp: 16    Neurovascular intact Dorsiflexion/Plantar flexion intact Incision: dressing C/D/I No cellulitis present Compartment soft  LABS  Basename 06/24/11 0451  HGB 13.4  HCT 39.6  WBC 15.0*  PLT 204     Basename 06/24/11 0451  NA 134*  K 4.6  BUN 15  CREATININE 0.96  GLUCOSE 141*     Assessment/Plan: 1 Day Post-Op Procedure(s) (LRB): TOTAL HIP ARTHROPLASTY ANTERIOR APPROACH (Left)   HV drain d/c'ed Foley cath d/c'ed Advance diet Up with therapy Plan for discharge tomorrow to home if continues to do well.   Anastasio Auerbach Ericah Scotto   PAC  06/24/2011, 8:37 AM

## 2011-06-24 NOTE — Progress Notes (Signed)
Physical Therapy Treatment Patient Details Name: Walter Kim MRN: [id number] DOB: May 02, 1960 Today's Date: 06/24/2011  PT Assessment/Plan  PT - Assessment/Plan Comments on Treatment Session: Pt limited by fatigue - will follow in pm. PT Plan: Discharge plan remains appropriate PT Frequency: 7X/week Recommendations for Other Services: OT consult Follow Up Recommendations: Home health PT Equipment Recommended: None recommended by PT PT Goals  Acute Rehab PT Goals PT Goal Formulation: With patient Time For Goal Achievement: 7 days Pt will go Supine/Side to Sit: with supervision PT Goal: Supine/Side to Sit - Progress: Goal set today Pt will go Sit to Supine/Side: with supervision PT Goal: Sit to Supine/Side - Progress: Goal set today Pt will go Sit to Stand: with supervision PT Goal: Sit to Stand - Progress: Goal set today Pt will go Stand to Sit: with supervision PT Goal: Stand to Sit - Progress: Goal set today Pt will Ambulate: 51 - 150 feet;with supervision;with rolling walker PT Goal: Ambulate - Progress: Goal set today  PT Treatment Precautions/Restrictions  Restrictions Weight Bearing Restrictions: No Other Position/Activity Restrictions: WBAT Mobility (including Balance) Bed Mobility Bed Mobility: Yes Supine to Sit: 3: Mod assist Supine to Sit Details (indicate cue type and reason): cues for sequence and use for use of UEs to self assist Sit to Supine: 3: Mod assist Sit to Supine - Details (indicate cue type and reason): cues for sequence with assist for LEs Transfers Transfers: Yes Sit to Stand: 1: +2 Total assist;With armrests;From chair/3-in-1;With upper extremity assist Sit to Stand Details (indicate cue type and reason): cues for LE position and use of UEs - pt 70% Stand to Sit: 1: +2 Total assist;To chair/3-in-1;With upper extremity assist Stand to Sit Details: cues for LE position and use of UEs - pt 70% Ambulation/Gait Ambulation/Gait: Yes Ambulation/Gait  Assistance: 1: +2 Total assist Ambulation/Gait Assistance Details (indicate cue type and reason): cues for posture and position from RW Ambulation Distance (Feet): 5 Feet Assistive device: Rolling walker Gait Pattern: Step-to pattern    Exercise  Total Joint Exercises Ankle Circles/Pumps: AROM;10 reps;Supine;Both Quad Sets: AROM;10 reps;Both;Supine Heel Slides: 10 reps;AAROM;Supine;Left Hip ABduction/ADduction: 10 reps;AAROM;Left;Supine End of Session PT - End of Session Equipment Utilized During Treatment: Gait belt Activity Tolerance: Patient limited by fatigue Patient left: in bed;with call bell in reach;with family/visitor present Nurse Communication: Mobility status for transfers;Mobility status for ambulation;Other (comment) General Behavior During Session: [facility] for tasks performed Cognition: [facility] for tasks performed  Walter Kim 06/24/2011, 12:40 PM

## 2011-06-24 NOTE — Plan of Care (Deleted)
Problem: Consults Goal: Diagnosis- Total Joint Replacement Outcome: Completed/Met Date Met:  06/24/11 Left Total Knee

## 2011-06-24 NOTE — Plan of Care (Signed)
Problem: Consults Goal: Diagnosis- Total Joint Replacement Outcome: Completed/Met Date Met:  06/24/11 Correction: left total hip (anterior)

## 2011-06-24 NOTE — Evaluation (Signed)
Physical Therapy Evaluation Patient Details Name: Walter Kim MRN: [id number] DOB: 1960/05/23 Today's Date: 06/24/2011  Problem List:  Patient Active Problem List  Diagnoses  . S/P left THA, AA    Past Medical History:  Past Medical History  Diagnosis Date  . PONV (postoperative nausea and vomiting)     after back surgery  . Arthritis    Past Surgical History:  Past Surgical History  Procedure Date  . Back surgery     20 yrs. ago  . Tonsillectomy     as child  . Right knee arthroscopy 4 yrs. ago    PT Assessment/Plan/Recommendation PT Assessment Clinical Impression Statement: Pt with L THR presents with decreased L LE strength/ROM , limited functional mobility and decreased BP with OOB activity. PT Recommendation/Assessment: Patient will need skilled PT in the acute care venue PT Problem List: Decreased strength;Decreased range of motion;Decreased activity tolerance;Decreased mobility;Decreased knowledge of use of DME;Pain;Obesity PT Therapy Diagnosis : Difficulty walking PT Plan PT Frequency: 7X/week PT Treatment/Interventions: DME instruction;Gait training;Stair training;Functional mobility training;Therapeutic activities;Therapeutic exercise;Patient/family education PT Recommendation Recommendations for Other Services: OT consult Follow Up Recommendations: Home health PT Equipment Recommended: None recommended by PT PT Goals  Acute Rehab PT Goals PT Goal Formulation: With patient Time For Goal Achievement: 7 days Pt will go Supine/Side to Sit: with supervision PT Goal: Supine/Side to Sit - Progress: Goal set today Pt will go Sit to Supine/Side: with supervision PT Goal: Sit to Supine/Side - Progress: Goal set today Pt will go Sit to Stand: with supervision PT Goal: Sit to Stand - Progress: Goal set today Pt will go Stand to Sit: with supervision PT Goal: Stand to Sit - Progress: Goal set today Pt will Ambulate: 51 - 150 feet;with supervision;with rolling  walker PT Goal: Ambulate - Progress: Goal set today  PT Evaluation Precautions/Restrictions  Restrictions Weight Bearing Restrictions: No Other Position/Activity Restrictions: WBAT Prior Functioning  Home Living Lives With: Spouse Receives Help From: Family Type of Home: House Home Layout: Multi-level Alternate Level Stairs-Rails: None (ELEVATOR) Alternate Level Stairs-Number of Steps: 0 (elevator) Home Access: Ramped entrance Home Adaptive Equipment: Walker - rolling Prior Function Level of Independence: Independent with basic ADLs;Independent with transfers;Independent with gait Able to Take Stairs?: Yes Driving: Yes Vocation: Full time employment Cognition Cognition Arousal/Alertness: Awake/alert Overall Cognitive Status: Appears within functional limits for tasks assessed Orientation Level: Oriented X4 Sensation/Coordination Coordination Gross Motor Movements are Fluid and Coordinated: Yes Extremity Assessment RUE Assessment RUE Assessment: Within Functional Limits LUE Assessment LUE Assessment: Within Functional Limits RLE Assessment RLE Assessment: Within Functional Limits LLE Assessment LLE Assessment: Exceptions to [facility] (hip flex to 70; abd 15 AAROM; hip strength 2/5) Mobility (including Balance) Bed Mobility Bed Mobility: Yes Supine to Sit: 3: Mod assist Supine to Sit Details (indicate cue type and reason): cues for sequence and use for use of UEs to self assist Transfers Transfers: Yes Sit to Stand: 1: +2 Total assist Sit to Stand Details (indicate cue type and reason): cues for LE position and use of UEs - pt 70% Stand to Sit: 1: +2 Total assist;With armrests;To chair/3-in-1 Stand to Sit Details: cues for LE postion and use of UEs Ambulation/Gait Ambulation/Gait: Yes Ambulation/Gait Assistance: 1: +2 Total assist Ambulation/Gait Assistance Details (indicate cue type and reason): cues for sequence, posture and position from RW - ltd by decreased  BP Ambulation Distance (Feet): 23 Feet Assistive device: Rolling walker Gait Pattern: Step-to pattern    Exercise  Total Joint Exercises Ankle Circles/Pumps: AROM;10  reps;Supine;Both Quad Sets: AROM;10 reps;Both;Supine Heel Slides: 10 reps;AAROM;Supine;Left Hip ABduction/ADduction: 10 reps;AAROM;Left;Supine End of Session PT - End of Session Equipment Utilized During Treatment: Gait belt Activity Tolerance: Treatment limited secondary to medical complications (Comment) Patient left: in chair;with call bell in reach;with family/visitor present Nurse Communication: Mobility status for transfers;Mobility status for ambulation;Other (comment) (BP standing 115/67; amb 82/47) General Behavior During Session: [facility] for tasks performed Cognition: [facility] for tasks performed  Deadra Diggins 06/24/2011, 12:37 PM

## 2011-06-24 NOTE — Progress Notes (Signed)
Physical Therapy Treatment Patient Details Name: Walter Kim MRN: [id number] DOB: 1960/05/11 Today's Date: 06/24/2011  PT Assessment/Plan  PT - Assessment/Plan Comments on Treatment Session: Marked improvement in activity tolerance this pm PT Plan: Discharge plan remains appropriate PT Frequency: 7X/week Follow Up Recommendations: Home health PT Equipment Recommended: None recommended by PT PT Goals  Acute Rehab PT Goals PT Goal Formulation: With patient Time For Goal Achievement: 7 days Pt will go Supine/Side to Sit: with supervision PT Goal: Supine/Side to Sit - Progress: Progressing toward goal Pt will go Sit to Supine/Side: with supervision PT Goal: Sit to Supine/Side - Progress: Progressing toward goal Pt will go Sit to Stand: with supervision PT Goal: Sit to Stand - Progress: Progressing toward goal Pt will go Stand to Sit: with supervision PT Goal: Stand to Sit - Progress: Progressing toward goal Pt will Ambulate: 51 - 150 feet;with supervision;with rolling walker PT Goal: Ambulate - Progress: Progressing toward goal  PT Treatment Precautions/Restrictions  Restrictions Weight Bearing Restrictions: No Other Position/Activity Restrictions: WBAT Mobility (including Balance) Bed Mobility Supine to Sit: 4: Min assist;3: Mod assist Supine to Sit Details (indicate cue type and reason): cues for sequence and use of R LE and UEs to self-assist Sit to Supine: 3: Mod assist Sit to Supine - Details (indicate cue type and reason): cues for sequence with assist for LEs Transfers Sit to Stand: 4: Min assist;From bed;With upper extremity assist Sit to Stand Details (indicate cue type and reason): cues for use of UEs and LE management Stand to Sit: 4: Min assist;With upper extremity assist;With armrests;To chair/3-in-1 Stand to Sit Details: cues for use of UEs and LE management Ambulation/Gait Ambulation/Gait Assistance: 4: Min assist Ambulation/Gait Assistance Details (indicate  cue type and reason): cues for posture and position from RW Ambulation Distance (Feet): 108 Feet Assistive device: Rolling walker Gait Pattern: Step-to pattern;Step-through pattern    Exercise    End of Session PT - End of Session Equipment Utilized During Treatment: Gait belt Activity Tolerance: Patient limited by fatigue Patient left: in chair;with call bell in reach;with family/visitor present Nurse Communication: Mobility status for transfers;Mobility status for ambulation;Other (comment) General Behavior During Session: [facility] for tasks performed Cognition: [facility] for tasks performed  Walter Kim 06/24/2011, 2:26 PM

## 2011-06-24 NOTE — Evaluation (Signed)
Physical Therapy Evaluation Patient Details Name: Walter Kim MRN: [id number] DOB: 1961-04-09 Today's Date: 06/24/2011  Problem List:  Patient Active Problem List  Diagnoses  . S/P left THA, AA    Past Medical History:  Past Medical History  Diagnosis Date  . PONV (postoperative nausea and vomiting)     after back surgery  . Arthritis    Past Surgical History:  Past Surgical History  Procedure Date  . Back surgery     20 yrs. ago  . Tonsillectomy     as child  . Right knee arthroscopy 4 yrs. ago    PT Assessment/Plan/Recommendation PT Assessment Clinical Impression Statement: Pt with L THR presents with decreased L LE strength/ROM , limited functional mobility and decreased BP with OOB activity. PT Recommendation/Assessment: Patient will need skilled PT in the acute care venue PT Problem List: Decreased strength;Decreased range of motion;Decreased activity tolerance;Decreased mobility;Decreased knowledge of use of DME;Pain;Obesity PT Therapy Diagnosis : Difficulty walking PT Plan PT Frequency: 7X/week PT Treatment/Interventions: DME instruction;Gait training;Stair training;Functional mobility training;Therapeutic activities;Therapeutic exercise;Patient/family education PT Recommendation Recommendations for Other Services: OT consult Follow Up Recommendations: Home health PT Equipment Recommended: None recommended by PT PT Goals  Acute Rehab PT Goals PT Goal Formulation: With patient Time For Goal Achievement: 7 days Pt will go Supine/Side to Sit: with supervision PT Goal: Supine/Side to Sit - Progress: Goal set today Pt will go Sit to Supine/Side: with supervision PT Goal: Sit to Supine/Side - Progress: Goal set today Pt will go Sit to Stand: with supervision PT Goal: Sit to Stand - Progress: Goal set today Pt will go Stand to Sit: with supervision PT Goal: Stand to Sit - Progress: Goal set today Pt will Ambulate: 51 - 150 feet;with supervision;with rolling  walker PT Goal: Ambulate - Progress: Goal set today  PT Evaluation Precautions/Restrictions  Restrictions Weight Bearing Restrictions: No Other Position/Activity Restrictions: WBAT Prior Functioning  Home Living Lives With: Spouse Receives Help From: Family Type of Home: House Home Layout: Multi-level Alternate Level Stairs-Rails: None (ELEVATOR) Alternate Level Stairs-Number of Steps: 0 (elevator) Home Access: Ramped entrance Home Adaptive Equipment: Walker - rolling Prior Function Level of Independence: Independent with basic ADLs;Independent with transfers;Independent with gait Able to Take Stairs?: Yes Driving: Yes Vocation: Full time employment Cognition Cognition Arousal/Alertness: Awake/alert Overall Cognitive Status: Appears within functional limits for tasks assessed Orientation Level: Oriented X4 Sensation/Coordination Coordination Gross Motor Movements are Fluid and Coordinated: Yes Extremity Assessment RUE Assessment RUE Assessment: Within Functional Limits LUE Assessment LUE Assessment: Within Functional Limits RLE Assessment RLE Assessment: Within Functional Limits LLE Assessment LLE Assessment: Exceptions to [facility] (hip flex to 70; abd 15 AAROM; hip strength 2/5) Mobility (including Balance) Bed Mobility Bed Mobility: Yes Supine to Sit: 3: Mod assist Supine to Sit Details (indicate cue type and reason): cues for sequence and use for use of UEs to self assist Transfers Transfers: Yes Sit to Stand: 1: +2 Total assist Sit to Stand Details (indicate cue type and reason): cues for LE position and use of UEs - pt 70% Stand to Sit: 1: +2 Total assist;With armrests;To chair/3-in-1 Stand to Sit Details: cues for LE postion and use of UEs Ambulation/Gait Ambulation/Gait: Yes Ambulation/Gait Assistance: 1: +2 Total assist Ambulation/Gait Assistance Details (indicate cue type and reason): cues for sequence, posture and position from RW - ltd by decreased  BP Ambulation Distance (Feet): 23 Feet Assistive device: Rolling walker Gait Pattern: Step-to pattern    Exercise  Total Joint Exercises Ankle Circles/Pumps: AROM;10  reps;Supine;Both Quad Sets: AROM;10 reps;Both;Supine Heel Slides: 10 reps;AAROM;Supine;Left Hip ABduction/ADduction: 10 reps;AAROM;Left;Supine End of Session PT - End of Session Equipment Utilized During Treatment: Gait belt Activity Tolerance: Treatment limited secondary to medical complications (Comment) Patient left: in chair;with call bell in reach;with family/visitor present Nurse Communication: Mobility status for transfers;Mobility status for ambulation General Behavior During Session: [facility] for tasks performed Cognition: [facility] for tasks performed  Walter Kim 06/24/2011, 12:34 PM

## 2011-06-25 LAB — BASIC METABOLIC PANEL
CO2: 26 mEq/L (ref 19–32)
Chloride: 104 mEq/L (ref 96–112)
Sodium: 136 mEq/L (ref 135–145)

## 2011-06-25 LAB — CBC
Platelets: 189 10*3/uL (ref 150–400)
RBC: 3.85 MIL/uL — ABNORMAL LOW (ref 4.22–5.81)
WBC: 15.6 10*3/uL — ABNORMAL HIGH (ref 4.0–10.5)

## 2011-06-25 NOTE — Progress Notes (Signed)
Physical Therapy Treatment Patient Details Name: Walter Kim MRN: [id number] DOB: [birth year] Today's Date: 06/25/2011  PT Assessment/Plan  PT - Assessment/Plan Comments on Treatment Session: Pt for d/c home today with HHPT.  Answered all questions/concerns of patient and wife.   PT Plan: Discharge plan remains appropriate Follow Up Recommendations: Home health PT Equipment Recommended: None recommended by PT PT Goals  Acute Rehab PT Goals PT Goal: Supine/Side to Sit - Progress: Met PT Goal: Sit to Supine/Side - Progress: Met PT Goal: Sit to Stand - Progress: Met PT Goal: Stand to Sit - Progress: Met PT Goal: Ambulate - Progress: Met  PT Treatment Precautions/Restrictions  Precautions Required Braces or Orthoses: No Restrictions Weight Bearing Restrictions: No Other Position/Activity Restrictions: WBAT Mobility (including Balance) Bed Mobility Bed Mobility: Yes Supine to Sit: 6: Modified independent (Device/Increase time) Supine to Sit Details (indicate cue type and reason): increased time Sit to Supine: 6: Modified independent (Device/Increase time) Sit to Supine - Details (indicate cue type and reason): increased time Transfers Transfers: Yes Sit to Stand: 6: Modified independent (Device/Increase time) Stand to Sit: 6: Modified independent (Device/Increase time) Ambulation/Gait Ambulation/Gait: Yes Ambulation/Gait Assistance: 6: Modified independent (Device/Increase time) Ambulation Distance (Feet): 150 Feet Assistive device: Rolling walker Gait Pattern: Step-to pattern Stairs: No Wheelchair Mobility Wheelchair Mobility: No  Posture/Postural Control Posture/Postural Control: No significant limitations Exercise    End of Session PT - End of Session Activity Tolerance: Patient tolerated treatment well Patient left: in chair;with call bell in reach;with family/visitor present Nurse Communication: Mobility status for transfers;Mobility status for ambulation;Other  (comment) General Behavior During Session: [facility] for tasks performed Cognition: [facility] for tasks performed  Newell Coral 06/25/2011, 12:06 PM  Newell Coral, PTA

## 2011-06-25 NOTE — Progress Notes (Signed)
CARE MANAGEMENT NOTE 06/25/2011  Patient:  Walter Kim, Walter Kim   Account Number:  [id number]  Date Initiated:  06/24/2011  Documentation initiated by:  Colleen Can  Subjective/Objective Assessment:   dx left hip osteoarthritis; anterior hip replacemnt     Action/Plan:   CM spoke with patient and spouse. Plans are for patient to return to his home in [place], Spouse will be caregiver. Pt requesting [facility] for hh  services & DME   Anticipated DC Date:  06/25/2011   Anticipated DC Plan:  HOME W HOME HEALTH SERVICES  In-house referral  NA      DC Planning Services  CM consult      [facility] Choice  HOME HEALTH   Choice offered to / List presented to:  C-1 Patient   DME arranged  NA      DME agency  NA     HH arranged  HH-2 PT      [facility] agency  [facility]   Status of service:  Completed, signed off Medicare Important Message given?  NA - LOS <3 / Initial given by admissions (If response is "NO", the following Medicare IM given date fields will be blank) Date Medicare IM given:   Date Additional Medicare IM given:    Discharge Disposition:  HOME W HOME HEALTH SERVICES  Per UR Regulation:    Comments:  List of choice HH agencies placed in shadow chart. [facility] will provide [facility] services and arrange for DME as needed. Start of services 06/26/2011.

## 2011-06-25 NOTE — Discharge Summary (Signed)
Physician Discharge Summary  Patient ID: Walter Kim MRN: [id number] DOB/AGE: [birth year] 51 y.o.  Admit date: 06/23/2011 Discharge date: 06/25/2011  Procedures:  Procedure(s) (LRB): TOTAL HIP ARTHROPLASTY ANTERIOR APPROACH (Left)  Attending Physician: Dr. Durene Romans  Admission Diagnoses:   Left hip OA and pain    Discharge Diagnoses:  Principal Problem:  *S/P left THA, AA PONV (postoperative nausea and vomiting)   Arthritis   HPI: Pt is a 51 y.o. male complaining of left hip pain for over 1.5 years. Pain had continually increased since the beginning. Originally thought the pain was from the knees, which have arthritis, but eventually the left hip was examined and determined to be a cause of most of his pain. X-rays in the clinic show end-stage arthritic changes of the left hip. Pt has tried various conservative treatments which have failed to alleviate their symptoms, including an 22222intraarticular injection. Various options are discussed with the patient. Risks, benefits and expectations were discussed with the patient. Patient understand the risks, benefits and expectations and wishes to proceed with surgery.  PCP: No primary provider on file.   Discharged Condition: good  Hospital Course:  Patient underwent the above stated procedure on 06/23/2011. Patient tolerated the procedure well and brought to the recovery room in good condition and subsequently to the floor.  POD #1 BP: 124/82 ; Pulse: 63 ; Temp: 98.2 F (36.8 C) ; Resp: 16  Pt's foley was removed, as well as the hemovac drain removed. IV was changed to a saline lock. Patient reports pain as mild. Pain well controlled. No events throughout the night.  Neurovascular intact, dorsiflexion/plantar flexion intact, incision: dressing C/Kim/I, no cellulitis present and compartment soft.  LABS  Basename  06/24/11 0451   HGB  13.4  HCT  39.6    POD #2  BP: 137/73 ; Pulse: 52 ; Temp: 98.3 F (36.8 C) ; Resp: 16  Patient  reports pain as mild. No events. Ready to be discharged home. Neurovascular intact, dorsiflexion/plantar flexion intact, incision: dressing C/Kim/I, no cellulitis present and compartment soft.  LABS  Basename  06/25/11 0430   HGB  12.4  HCT  36.1    Discharge Exam: General appearance: alert, cooperative and no distress Extremities: Homans sign is negative, no sign of DVT, no edema, redness or tenderness in the calves or thighs and no ulcers, gangrene or trophic changes  Disposition: Home with follow up in 2 weeks  Follow-up Information    Follow up with OLIN,Walter Kim in 2 weeks.   Contact information:   [facility] [address] [place] [address] [phone]          Discharge Orders    Future Orders Please Complete By Expires   Diet - low sodium heart healthy      Call MD / Call 911      Comments:   If you experience chest pain or shortness of breath, CALL 911 and be transported to the hospital emergency room.  If you develope a fever above 101 F, pus (white drainage) or increased drainage or redness at the wound, or calf pain, call your surgeon's office.   Discharge instructions      Comments:   Maintain surgical dressing for 8 days, then replace with gauze and tape. Keep the area dry and clean until follow up. Follow up in 2 weeks at [facility]. Call with any questions or concerns.     Constipation Prevention      Comments:  Drink plenty of fluids.  Prune juice may be helpful.  You may use a stool softener, such as Colace (over the counter) 100 mg twice a day.  Use MiraLax (over the counter) for constipation as needed.   Increase activity slowly as tolerated      Weight Bearing as taught in Physical Therapy      Comments:   Use a walker or crutches as instructed.   Driving restrictions      Comments:   No driving for 4 weeks   Change dressing      Comments:   Maintain surgical dressing for 8 days, then  replace with 4x4 guaze and tape. Keep the area dry and clean.   TED hose      Comments:   Use stockings (TED hose) for 2 weeks on both leg(s).  You may remove them at night for sleeping.      Discharge Medication List as of 06/25/2011  8:18 AM    START taking these medications   Details  aspirin EC 325 MG tablet Take 1 tablet (325 mg total) by mouth 2 (two) times daily. X 4 weeks, Starting 06/24/2011, Until Sat 07/04/11, No Print    celecoxib (CELEBREX) 200 MG capsule Take 1 capsule (200 mg total) by mouth 2 (two) times daily., Starting 06/24/2011, Until Fri 07/24/11, No Print    diphenhydrAMINE (BENADRYL) 25 mg capsule Take 1 capsule (25 mg total) by mouth every 6 (six) hours as needed for itching, allergies or sleep., Starting 06/24/2011, Until Sat 07/04/11, No Print    docusate sodium 100 MG CAPS Take 100 mg by mouth 2 (two) times daily., Starting 06/24/2011, Until Sat 07/04/11, No Print    ferrous sulfate 325 (65 FE) MG tablet Take 1 tablet (325 mg total) by mouth 3 (three) times daily after meals., Starting 06/24/2011, Until Thu 06/23/12, No Print    HYDROcodone-acetaminophen (NORCO) 5-325 MG per tablet Take 1.5-3 tablets by mouth every 4 (four) hours., Starting 06/24/2011, Until Sat 07/04/11, Print    methocarbamol (ROBAXIN) 500 MG tablet Take 1 tablet (500 mg total) by mouth every 6 (six) hours as needed (muscle spasms)., Starting 06/24/2011, Until Sat 07/04/11, No Print    polyethylene glycol (MIRALAX / GLYCOLAX) packet Take 17 g by mouth 2 (two) times daily., Starting 06/24/2011, Until Sat 06/27/11, No Print      CONTINUE these medications which have NOT CHANGED   Details  cholecalciferol (VITAMIN Kim) 1000 UNITS tablet Take 1,000 Units by mouth once a week., Until Discontinued, Historical Med      STOP taking these medications     ibuprofen (ADVIL,MOTRIN) 200 MG tablet Comments:  Reason for Stopping:          Signed: Anastasio Kim. Walter Kim   PAC  06/25/2011, 5:19 PM

## 2011-06-25 NOTE — Progress Notes (Signed)
Subjective: 2 Days Post-Op Procedure(s) (LRB): TOTAL HIP ARTHROPLASTY ANTERIOR APPROACH (Left)   Patient reports pain as mild. No events. Ready to be discharged home.  Objective:   VITALS:   Filed Vitals:   06/25/11 0440  BP: 137/73  Pulse: 52  Temp: 98.3 F (36.8 C)  Resp: 16    Neurovascular intact Dorsiflexion/Plantar flexion intact Incision: dressing C/D/I No cellulitis present Compartment soft  LABS  Basename 06/25/11 0430 06/24/11 0451  HGB 12.4* 13.4  HCT 36.1* 39.6  WBC 15.6* 15.0*  PLT 189 204     Basename 06/25/11 0430 06/24/11 0451  NA 136 134*  K 4.2 4.6  BUN 13 15  CREATININE 0.85 0.96  GLUCOSE 146* 141*     Assessment/Plan: 2 Days Post-Op Procedure(s) (LRB): TOTAL HIP ARTHROPLASTY ANTERIOR APPROACH (Left)   Up with therapy Discharge home with home health Follow up in 2 weeks at [facility].  Follow-up Information    Follow up with OLIN,Stayce Delancy D in 2 weeks.   Contact information:   [facility] [address] [place] [address] [phone]          Anastasio Auerbach. Arienna Benegas   PAC  06/25/2011, 7:44 AM

## 2011-07-15 ENCOUNTER — Encounter (HOSPITAL_COMMUNITY): Payer: Self-pay | Admitting: Orthopedic Surgery

## 2011-09-11 ENCOUNTER — Encounter (HOSPITAL_COMMUNITY): Payer: Self-pay | Admitting: Pharmacy Technician

## 2011-09-17 ENCOUNTER — Encounter (HOSPITAL_COMMUNITY): Payer: Self-pay

## 2011-09-17 ENCOUNTER — Encounter (HOSPITAL_COMMUNITY)
Admission: RE | Admit: 2011-09-17 | Discharge: 2011-09-17 | Disposition: A | Discharge status: Home / Self Care | Payer: BC Managed Care – PPO | Source: Ambulatory Visit | Attending: Orthopedic Surgery | Admitting: Orthopedic Surgery

## 2011-09-17 HISTORY — DX: Sleep apnea, unspecified: G47.30

## 2011-09-17 HISTORY — DX: Anemia, unspecified: D64.9

## 2011-09-17 LAB — BASIC METABOLIC PANEL
CO2: 24 mEq/L (ref 19–32)
Calcium: 10 mg/dL (ref 8.4–10.5)
Chloride: 100 mEq/L (ref 96–112)
GFR calc Af Amer: >90 mL/min (ref >90)
Sodium: 136 mEq/L (ref 135–145)

## 2011-09-17 LAB — CBC
MCV: 93.5 fL (ref 78.0–100.0)
Platelets: 233 10*3/uL (ref 150–400)
RDW: 13.3 % (ref 11.5–15.5)
WBC: 8.1 10*3/uL (ref 4.0–10.5)

## 2011-09-17 LAB — URINALYSIS, ROUTINE W REFLEX MICROSCOPIC
Ketones, ur: NEGATIVE mg/dL
Protein, ur: NEGATIVE mg/dL
Urobilinogen, UA: 0.2 mg/dL (ref 0.0–1.0)

## 2011-09-17 LAB — DIFFERENTIAL
Basophils Absolute: 0 10*3/uL (ref 0.0–0.1)
Basophils Relative: 1 % (ref 0–1)
Eosinophils Relative: 1 % (ref 0–5)
Lymphocytes Relative: 30 % (ref 12–46)
Neutro Abs: 5.1 10*3/uL (ref 1.7–7.7)

## 2011-09-17 LAB — SURGICAL PCR SCREEN: MRSA, PCR: NEGATIVE

## 2011-09-17 LAB — PROTIME-INR: Prothrombin Time: 13 seconds (ref 11.6–15.2)

## 2011-09-17 LAB — APTT: aPTT: 31 seconds (ref 24–37)

## 2011-09-17 MED ORDER — CEFAZOLIN SODIUM 1-5 GM-% IV SOLN: 1.0000 g | INTRAVENOUS | Status: DC

## 2011-09-17 NOTE — Progress Notes (Signed)
09/17/11 1316  OBSTRUCTIVE SLEEP APNEA  Have you ever been diagnosed with sleep apnea through a sleep study? No  Do you snore loudly (loud enough to be heard through closed doors)?  1  Do you often feel tired, fatigued, or sleepy during the daytime? 0  Has anyone observed you stop breathing during your sleep? 0  Do you have, or are you being treated for high blood pressure? 0  BMI more than 35 kg/m2? 1  Age over 51 years old? 1  Neck circumference greater than 40 cm/18 inches? 0  Gender: 1  Obstructive Sleep Apnea Score 4   Score 4 or greater  Updated health history;Results sent to PCP

## 2011-09-17 NOTE — Patient Instructions (Addendum)
20 Walter Kim  09/17/2011   Your procedure is scheduled on: 09-22-2011   Report to [facility] at  100 PM  Call this number if you have problems the morning of surgery: [phone]   Remember:   Do not eat food:After Midnight.  Clear liquids midnight until 0930 am, then nothing by mouth  Take these medicines the morning of surgery with A SIP OF WATER: no meds to take   Do not wear jewelry or make up.  Do not wear lotions, powders, or perfumes.Do not wear deodorant.    Do not bring valuables to the hospital.  Contacts, dentures or bridgework may not be worn into surgery.  Leave suitcase in the car. After surgery it may be brought to your room.  For patients admitted to the hospital, checkout time is 11:00 AM the day of discharge.     Special Instructions: CHG Shower Use Special Wash: 1/2 bottle night before surgery and 1/2 bottle morning of surgery.neck down avoid private area   Please read over the following fact sheets that you were given: MRSA Information, blood fact sheet, incentive spirometer fact sheet  Cain Sieve [facility] pre op nurse phone number [phone], call if needed

## 2011-09-17 NOTE — Pre-Procedure Instructions (Signed)
ekg 06-09-2011 dr Sullivan Lone on chart

## 2011-09-21 NOTE — H&P (Signed)
Walter Kim is an 51 y.o. male.    Chief Complaint: Right knee medial compartment OA and pain   HPI: Pt is a 51 y.o. male complaining of right knee pain for 3-4 years. Pain had continually increased since the beginning. X-rays in the clinic show arthritic changes of the right knee, an MRI was obtained which showed medial compartment OA changes. Pt has tried various conservative treatments which have failed to alleviate their symptoms, including steroid injections, hyaluronic acid injections as well as previous athroscopies . Various options are discussed with the patient. Risks, benefits and expectations were discussed with the patient. Patient understand the risks, benefits and expectations and wishes to proceed with surgery.   PCP:  No primary provider on file.  D/C Plans:  Home with HHPT  Post-op Meds:  Rx given for ASA, Robaxin, Iron, Colace and MiraLax  Tranexamic Acid:   To be given  Decadron:   To be given  PMH: Past Medical History  Diagnosis Date  . Arthritis   . PONV (postoperative nausea and vomiting)     after back surgery and last hip surgery  06-23-2011  . Anemia     after 06-23-11 surgery  . Sleep apnea     stopbang=4    PSH: Past Surgical History  Procedure Date  . Right knee arthroscopy 4 yrs. ago  . Total hip arthroplasty 06/23/2011    Procedure: TOTAL HIP ARTHROPLASTY ANTERIOR APPROACH;  Surgeon: [name], MD;  Location: [facility];  Service: Orthopedics;  Laterality: Left;  . Back surgery     20 yrs. ago  . Tonsillectomy     as child    Social History:  reports that he has never smoked. He has never used smokeless tobacco. He reports that he drinks about 1.2 ounces of alcohol per week. He reports that he does not use illicit drugs.  Allergies:  No Known Allergies  Medications: No current facility-administered medications for this encounter.   Current Outpatient Prescriptions  Medication Sig Dispense Refill  . cholecalciferol (VITAMIN D) 1000  UNITS tablet Take 1,000 Units by mouth once a week. On mondays       . diphenhydrAMINE (BENADRYL) 25 mg capsule Take 1 capsule (25 mg total) by mouth every 6 (six) hours as needed for itching, allergies or sleep.      Marland Kitchen ibuprofen (ADVIL,MOTRIN) 200 MG tablet Take 400 mg by mouth every 6 (six) hours as needed. For pain        ROS: Review of Systems  Constitutional: Negative.   HENT: Negative.   Eyes: Negative.   Respiratory: Negative.   Cardiovascular: Negative.   Gastrointestinal: Negative.   Genitourinary: Negative.   Musculoskeletal: Positive for joint pain.  Skin: Negative.   Neurological: Negative.   Endo/Heme/Allergies: Positive for environmental allergies.  Psychiatric/Behavioral: Negative.      Physical Exam: Physical Exam  Constitutional: He is oriented to person, place, and time and well-developed, well-nourished, and in no distress.  HENT:  Head: Normocephalic and atraumatic.  Nose: Nose normal.  Mouth/Throat: Oropharynx is clear and moist.  Eyes: Pupils are equal, round, and reactive to light.  Neck: Neck supple. No JVD present. No tracheal deviation present. No thyromegaly present.  Cardiovascular: Normal rate, regular rhythm, normal heart sounds and intact distal pulses.   Pulmonary/Chest: Effort normal and breath sounds normal. No respiratory distress. He has no wheezes. He exhibits no tenderness.  Abdominal: Soft. There is no tenderness. There is no guarding.  Musculoskeletal:  Right knee: He exhibits decreased range of motion, swelling and bony tenderness. He exhibits no effusion, no ecchymosis, no deformity and no laceration. tenderness found. Medial joint line tenderness noted.  Lymphadenopathy:    He has no cervical adenopathy.  Neurological: He is alert and oriented to person, place, and time.  Skin: Skin is warm and dry.  Psychiatric: Mood and affect normal.     Assessment/Plan Assessment:  Right knee medial compartment OA and pain    Plan: Patient will undergo a right unilateral knee arthroplasty on 09/22/2011 per Dr. Charlann Boxer at [facility]. Risks benefits and expectation were discussed with the patient. Patient understand risks, benefits and expectation and wishes to proceed.   Anastasio Auerbach Kassim Guertin   PAC  09/21/2011, 8:09 AM

## 2011-09-22 ENCOUNTER — Encounter (HOSPITAL_COMMUNITY)
Admission: RE | Disposition: A | Discharge status: Home Health | Payer: Self-pay | Source: Ambulatory Visit | Attending: Orthopedic Surgery

## 2011-09-22 ENCOUNTER — Encounter (HOSPITAL_COMMUNITY): Payer: Self-pay | Admitting: Anesthesiology

## 2011-09-22 ENCOUNTER — Inpatient Hospital Stay (HOSPITAL_COMMUNITY)
Admission: RE | Admit: 2011-09-22 | Discharge: 2011-09-23 | DRG: 209 | Disposition: A | Discharge status: Home Health | Payer: BC Managed Care – PPO | Source: Ambulatory Visit | Attending: Orthopedic Surgery | Admitting: Orthopedic Surgery

## 2011-09-22 ENCOUNTER — Encounter (HOSPITAL_COMMUNITY): Payer: Self-pay | Admitting: *Deleted

## 2011-09-22 ENCOUNTER — Ambulatory Visit (HOSPITAL_COMMUNITY): Payer: BC Managed Care – PPO | Admitting: Anesthesiology

## 2011-09-22 DIAGNOSIS — G4730 Sleep apnea, unspecified: Secondary | ICD-10-CM | POA: Diagnosis present

## 2011-09-22 DIAGNOSIS — Z01812 Encounter for preprocedural laboratory examination: Secondary | ICD-10-CM

## 2011-09-22 DIAGNOSIS — M1710 Unilateral primary osteoarthritis, unspecified knee: Principal | ICD-10-CM | POA: Diagnosis present

## 2011-09-22 DIAGNOSIS — Z96651 Presence of right artificial knee joint: Secondary | ICD-10-CM

## 2011-09-22 DIAGNOSIS — Z96649 Presence of unspecified artificial hip joint: Secondary | ICD-10-CM

## 2011-09-22 HISTORY — PX: PARTIAL KNEE ARTHROPLASTY: SHX2174

## 2011-09-22 LAB — TYPE AND SCREEN: Antibody Screen: NEGATIVE

## 2011-09-22 SURGERY — ARTHROPLASTY, KNEE, UNICOMPARTMENTAL
Anesthesia: Spinal | Site: Knee | Laterality: Right | Wound class: Clean

## 2011-09-22 MED ORDER — BISACODYL 5 MG PO TBEC: 5.0000 mg | DELAYED_RELEASE_TABLET | Freq: Every day | ORAL | Status: DC | PRN

## 2011-09-22 MED ORDER — TRANEXAMIC ACID 100 MG/ML IV SOLN
1800.0000 mg | Freq: Once | INTRAVENOUS | Status: AC
  Administered 2011-09-22: 1800 mg via INTRAVENOUS
  Filled 2011-09-22: qty 18

## 2011-09-22 MED ORDER — METHOCARBAMOL 100 MG/ML IJ SOLN
500.0000 mg | Freq: Four times a day (QID) | INTRAVENOUS | Status: DC | PRN
  Filled 2011-09-22: qty 5

## 2011-09-22 MED ORDER — HYDROMORPHONE HCL PF 1 MG/ML IJ SOLN
0.5000 mg | INTRAMUSCULAR | Status: DC | PRN
  Administered 2011-09-23 (×2): 1 mg via INTRAVENOUS
  Filled 2011-09-22 (×2): qty 1

## 2011-09-22 MED ORDER — CEFAZOLIN SODIUM-DEXTROSE 2-3 GM-% IV SOLR
2.0000 g | Freq: Once | INTRAVENOUS | Status: AC
  Administered 2011-09-22: 2 g via INTRAVENOUS

## 2011-09-22 MED ORDER — ZOLPIDEM TARTRATE 5 MG PO TABS: 5.0000 mg | ORAL_TABLET | Freq: Every evening | ORAL | Status: DC | PRN

## 2011-09-22 MED ORDER — FENTANYL CITRATE 0.05 MG/ML IJ SOLN: 25.0000 ug | INTRAMUSCULAR | Status: DC | PRN

## 2011-09-22 MED ORDER — LACTATED RINGERS IV SOLN
INTRAVENOUS | Status: DC
  Administered 2011-09-22: 15:00:00 via INTRAVENOUS

## 2011-09-22 MED ORDER — DIPHENHYDRAMINE HCL 25 MG PO CAPS: 25.0000 mg | ORAL_CAPSULE | Freq: Four times a day (QID) | ORAL | Status: DC | PRN

## 2011-09-22 MED ORDER — ACETAMINOPHEN 10 MG/ML IV SOLN
INTRAVENOUS | Status: DC | PRN
  Administered 2011-09-22: 1000 mg via INTRAVENOUS

## 2011-09-22 MED ORDER — POLYETHYLENE GLYCOL 3350 17 G PO PACK
17.0000 g | PACK | Freq: Two times a day (BID) | ORAL | Status: DC
  Administered 2011-09-23: 17 g via ORAL
  Filled 2011-09-22 (×3): qty 1

## 2011-09-22 MED ORDER — DEXAMETHASONE SODIUM PHOSPHATE 10 MG/ML IJ SOLN
10.0000 mg | Freq: Once | INTRAMUSCULAR | Status: AC
  Administered 2011-09-22: 10 mg via INTRAVENOUS

## 2011-09-22 MED ORDER — LIDOCAINE HCL (CARDIAC) 20 MG/ML IV SOLN
INTRAVENOUS | Status: DC | PRN
  Administered 2011-09-22: 100 mg via INTRAVENOUS

## 2011-09-22 MED ORDER — PROPOFOL 10 MG/ML IV EMUL
INTRAVENOUS | Status: DC | PRN
  Administered 2011-09-22: 75 ug/kg/min via INTRAVENOUS

## 2011-09-22 MED ORDER — METHOCARBAMOL 500 MG PO TABS: 500.0000 mg | ORAL_TABLET | Freq: Four times a day (QID) | ORAL | Status: DC | PRN

## 2011-09-22 MED ORDER — BUPIVACAINE IN DEXTROSE 0.75-8.25 % IT SOLN
INTRATHECAL | Status: DC | PRN
  Administered 2011-09-22: 2 mL via INTRATHECAL

## 2011-09-22 MED ORDER — KETOROLAC TROMETHAMINE 30 MG/ML IJ SOLN
INTRAMUSCULAR | Status: AC
  Filled 2011-09-22: qty 1

## 2011-09-22 MED ORDER — KETOROLAC TROMETHAMINE 30 MG/ML IJ SOLN
INTRAMUSCULAR | Status: DC | PRN
  Administered 2011-09-22: 30 mg via INTRAMUSCULAR

## 2011-09-22 MED ORDER — ONDANSETRON HCL 4 MG/2ML IJ SOLN
INTRAMUSCULAR | Status: DC | PRN
  Administered 2011-09-22: 4 mg via INTRAVENOUS

## 2011-09-22 MED ORDER — FERROUS SULFATE 325 (65 FE) MG PO TABS
325.0000 mg | ORAL_TABLET | Freq: Three times a day (TID) | ORAL | Status: DC
  Administered 2011-09-23 (×2): 325 mg via ORAL
  Filled 2011-09-22 (×4): qty 1

## 2011-09-22 MED ORDER — RIVAROXABAN 10 MG PO TABS
10.0000 mg | ORAL_TABLET | Freq: Every day | ORAL | Status: DC
  Administered 2011-09-23: 10 mg via ORAL
  Filled 2011-09-22 (×2): qty 1

## 2011-09-22 MED ORDER — MIDAZOLAM HCL 5 MG/5ML IJ SOLN
INTRAMUSCULAR | Status: DC | PRN
  Administered 2011-09-22: 2 mg via INTRAVENOUS

## 2011-09-22 MED ORDER — SCOPOLAMINE 1 MG/3DAYS TD PT72
MEDICATED_PATCH | TRANSDERMAL | Status: AC
  Filled 2011-09-22: qty 1

## 2011-09-22 MED ORDER — PROMETHAZINE HCL 25 MG/ML IJ SOLN: 6.2500 mg | INTRAMUSCULAR | Status: DC | PRN

## 2011-09-22 MED ORDER — ACETAMINOPHEN 10 MG/ML IV SOLN
INTRAVENOUS | Status: AC
  Filled 2011-09-22: qty 100

## 2011-09-22 MED ORDER — 0.9 % SODIUM CHLORIDE (POUR BTL) OPTIME
TOPICAL | Status: DC | PRN
  Administered 2011-09-22: 1000 mL

## 2011-09-22 MED ORDER — CEFAZOLIN SODIUM-DEXTROSE 2-3 GM-% IV SOLR
INTRAVENOUS | Status: AC
  Filled 2011-09-22: qty 50

## 2011-09-22 MED ORDER — MENTHOL 3 MG MT LOZG
1.0000 | LOZENGE | OROMUCOSAL | Status: DC | PRN
  Filled 2011-09-22: qty 9

## 2011-09-22 MED ORDER — CELECOXIB 200 MG PO CAPS
200.0000 mg | ORAL_CAPSULE | Freq: Two times a day (BID) | ORAL | Status: DC
  Administered 2011-09-22 – 2011-09-23 (×2): 200 mg via ORAL
  Filled 2011-09-22 (×3): qty 1

## 2011-09-22 MED ORDER — ALUM & MAG HYDROXIDE-SIMETH 200-200-20 MG/5ML PO SUSP: 30.0000 mL | ORAL | Status: DC | PRN

## 2011-09-22 MED ORDER — HYDROCODONE-ACETAMINOPHEN 7.5-325 MG PO TABS
1.0000 | ORAL_TABLET | ORAL | Status: DC
  Administered 2011-09-22 – 2011-09-23 (×2): 2 via ORAL
  Administered 2011-09-23: 1 via ORAL
  Administered 2011-09-23 (×2): 2 via ORAL
  Filled 2011-09-22 (×5): qty 2

## 2011-09-22 MED ORDER — PHENOL 1.4 % MT LIQD
1.0000 | OROMUCOSAL | Status: DC | PRN
  Filled 2011-09-22: qty 177

## 2011-09-22 MED ORDER — CEFAZOLIN SODIUM-DEXTROSE 2-3 GM-% IV SOLR
2.0000 g | Freq: Four times a day (QID) | INTRAVENOUS | Status: AC
  Administered 2011-09-23 (×2): 2 g via INTRAVENOUS
  Filled 2011-09-22 (×2): qty 50

## 2011-09-22 MED ORDER — FLEET ENEMA 7-19 GM/118ML RE ENEM: 1.0000 | ENEMA | Freq: Once | RECTAL | Status: AC | PRN

## 2011-09-22 MED ORDER — ONDANSETRON HCL 4 MG/2ML IJ SOLN: 4.0000 mg | Freq: Four times a day (QID) | INTRAMUSCULAR | Status: DC | PRN

## 2011-09-22 MED ORDER — BUPIVACAINE-EPINEPHRINE 0.25% -1:200000 IJ SOLN
INTRAMUSCULAR | Status: DC | PRN
  Administered 2011-09-22: 30 mg

## 2011-09-22 MED ORDER — SCOPOLAMINE 1 MG/3DAYS TD PT72
1.0000 | MEDICATED_PATCH | Freq: Once | TRANSDERMAL | Status: DC
  Administered 2011-09-22: 1.5 mg via TRANSDERMAL
  Filled 2011-09-22: qty 1

## 2011-09-22 MED ORDER — ONDANSETRON HCL 4 MG PO TABS: 4.0000 mg | ORAL_TABLET | Freq: Four times a day (QID) | ORAL | Status: DC | PRN

## 2011-09-22 MED ORDER — DOCUSATE SODIUM 100 MG PO CAPS
100.0000 mg | ORAL_CAPSULE | Freq: Two times a day (BID) | ORAL | Status: DC
  Administered 2011-09-22 – 2011-09-23 (×2): 100 mg via ORAL
  Filled 2011-09-22 (×3): qty 1

## 2011-09-22 MED ORDER — LACTATED RINGERS IV SOLN: INTRAVENOUS | Status: DC

## 2011-09-22 MED ORDER — SODIUM CHLORIDE 0.9 % IV SOLN
INTRAVENOUS | Status: DC
  Administered 2011-09-22: 21:00:00 via INTRAVENOUS
  Filled 2011-09-22 (×3): qty 1000

## 2011-09-22 MED ORDER — BUPIVACAINE-EPINEPHRINE PF 0.25-1:200000 % IJ SOLN
INTRAMUSCULAR | Status: AC
  Filled 2011-09-22: qty 30

## 2011-09-22 MED ORDER — FENTANYL CITRATE 0.05 MG/ML IJ SOLN
INTRAMUSCULAR | Status: DC | PRN
  Administered 2011-09-22: 50 ug via INTRAVENOUS
  Administered 2011-09-22: 25 ug via INTRAVENOUS

## 2011-09-22 SURGICAL SUPPLY — 47 items
BAG ZIPLOCK 12X15 (MISCELLANEOUS) ×2 IMPLANT
BANDAGE ELASTIC 6 VELCRO ST LF (GAUZE/BANDAGES/DRESSINGS) ×2 IMPLANT
BANDAGE ESMARK 6X9 LF (GAUZE/BANDAGES/DRESSINGS) ×1 IMPLANT
BLADE SAW RECIPROCATING 77.5 (BLADE) ×2 IMPLANT
BLADE SAW SGTL 13.0X1.19X90.0M (BLADE) ×2 IMPLANT
BNDG ESMARK 6X9 LF (GAUZE/BANDAGES/DRESSINGS) ×2
BOWL SMART MIX CTS (DISPOSABLE) ×2 IMPLANT
CEMENT HV SMART SET (Cement) ×2 IMPLANT
CLOTH BEACON ORANGE TIMEOUT ST (SAFETY) ×2 IMPLANT
COVER SURGICAL LIGHT HANDLE (MISCELLANEOUS) ×2 IMPLANT
CUFF TOURN SGL QUICK 34 (TOURNIQUET CUFF) ×1
CUFF TRNQT CYL 34X4X40X1 (TOURNIQUET CUFF) ×1 IMPLANT
DERMABOND ADVANCED (GAUZE/BANDAGES/DRESSINGS) ×1
DERMABOND ADVANCED .7 DNX12 (GAUZE/BANDAGES/DRESSINGS) ×1 IMPLANT
DRAPE EXTREMITY T 121X128X90 (DRAPE) ×2 IMPLANT
DRAPE POUCH INSTRU U-SHP 10X18 (DRAPES) ×2 IMPLANT
DRSG AQUACEL AG ADV 3.5X 6 (GAUZE/BANDAGES/DRESSINGS) ×2 IMPLANT
DRSG TEGADERM 4X4.75 (GAUZE/BANDAGES/DRESSINGS) ×2 IMPLANT
DURAPREP 26ML APPLICATOR (WOUND CARE) ×2 IMPLANT
ELECT REM PT RETURN 9FT ADLT (ELECTROSURGICAL) ×2
ELECTRODE REM PT RTRN 9FT ADLT (ELECTROSURGICAL) ×1 IMPLANT
EVACUATOR 1/8 PVC DRAIN (DRAIN) ×2 IMPLANT
FACESHIELD LNG OPTICON STERILE (SAFETY) ×8 IMPLANT
GAUZE SPONGE 2X2 8PLY STRL LF (GAUZE/BANDAGES/DRESSINGS) ×1 IMPLANT
GLOVE BIOGEL PI IND STRL 7.5 (GLOVE) ×1 IMPLANT
GLOVE BIOGEL PI IND STRL 8 (GLOVE) IMPLANT
GLOVE BIOGEL PI INDICATOR 7.5 (GLOVE) ×1
GLOVE BIOGEL PI INDICATOR 8 (GLOVE)
GLOVE ORTHO TXT STRL SZ7.5 (GLOVE) ×4 IMPLANT
GOWN BRE IMP PREV XXLGXLNG (GOWN DISPOSABLE) ×4 IMPLANT
GOWN STRL NON-REIN LRG LVL3 (GOWN DISPOSABLE) ×2 IMPLANT
KIT BASIN OR (CUSTOM PROCEDURE TRAY) ×2 IMPLANT
LEGGING LITHOTOMY PAIR STRL (DRAPES) ×2 IMPLANT
MANIFOLD NEPTUNE II (INSTRUMENTS) ×2 IMPLANT
NDL SAFETY ECLIPSE 18X1.5 (NEEDLE) ×1 IMPLANT
NEEDLE HYPO 18GX1.5 SHARP (NEEDLE) ×1
PACK TOTAL JOINT (CUSTOM PROCEDURE TRAY) ×2 IMPLANT
POSITIONER SURGICAL ARM (MISCELLANEOUS) ×2 IMPLANT
SPONGE GAUZE 2X2 STER 10/PKG (GAUZE/BANDAGES/DRESSINGS) ×1
SUCTION FRAZIER TIP 10 FR DISP (SUCTIONS) ×2 IMPLANT
SUT MNCRL AB 4-0 PS2 18 (SUTURE) ×2 IMPLANT
SUT VIC AB 1 CT1 36 (SUTURE) ×4 IMPLANT
SUT VIC AB 2-0 CT1 27 (SUTURE) ×2
SUT VIC AB 2-0 CT1 TAPERPNT 27 (SUTURE) ×2 IMPLANT
SYR 50ML LL SCALE MARK (SYRINGE) ×2 IMPLANT
TOWEL OR 17X26 10 PK STRL BLUE (TOWEL DISPOSABLE) ×4 IMPLANT
TRAY FOLEY CATH 14FRSI W/METER (CATHETERS) ×2 IMPLANT

## 2011-09-22 NOTE — Preoperative (Signed)
Beta Blockers   Reason not to administer Beta Blockers:Not Applicable 

## 2011-09-22 NOTE — Anesthesia Preprocedure Evaluation (Addendum)
Anesthesia Evaluation  Patient identified by MRN, date of birth, ID band Patient awake    Reviewed: Allergy & Precautions, H&P , NPO status , Patient's Chart, lab work & pertinent test results  History of Anesthesia Complications (+) PONV  Airway Mallampati: III  Neck ROM: Full    Dental  (+) Teeth Intact and Dental Advisory Given   Pulmonary neg pulmonary ROS, sleep apnea ,  breath sounds clear to auscultation  Pulmonary exam normal       Cardiovascular negative cardio ROS  Rhythm:Regular Rate:Normal     Neuro/Psych negative neurological ROS  negative psych ROS   GI/Hepatic negative GI ROS, Neg liver ROS,   Endo/Other  negative endocrine ROSMorbid obesity  Renal/GU negative Renal ROS  negative genitourinary   Musculoskeletal negative musculoskeletal ROS (+)   Abdominal   Peds  Hematology negative hematology ROS (+)   Anesthesia Other Findings   Reproductive/Obstetrics negative OB ROS                           Anesthesia Physical Anesthesia Plan  ASA: I  Anesthesia Plan: Spinal   Post-op Pain Management:    Induction: Intravenous  Airway Management Planned: Simple Face Mask  Additional Equipment:   Intra-op Plan:   Post-operative Plan:   Informed Consent: I have reviewed the patients History and Physical, chart, labs and discussed the procedure including the risks, benefits and alternatives for the proposed anesthesia with the patient or authorized representative who has indicated his/her understanding and acceptance.   Dental advisory given  Plan Discussed with: CRNA  Anesthesia Plan Comments:         Anesthesia Quick Evaluation

## 2011-09-22 NOTE — Interval H&P Note (Signed)
History and Physical Interval Note:  09/22/2011 2:01 PM  Walter Kim  has presented today for surgery, with the diagnosis of Right Knee Medial Compartmental Osteoarthritis  The various methods of treatment have been discussed with the patient and family. After consideration of risks, benefits and other options for treatment, the patient has consented to  Procedure(s) (LRB): RIGHT KNEE UNICOMPARTMENTAL KNEE REPLACEMENT (Right) as a surgical intervention .  The patients' history has been reviewed, patient examined, no change in status, stable for surgery.  I have reviewed the patients' chart and labs.  Questions were answered to the patient's satisfaction.     [name]

## 2011-09-22 NOTE — Transfer of Care (Signed)
Immediate Anesthesia Transfer of Care Note  Patient: Walter Kim  Procedure(s) Performed: Procedure(s) (LRB): UNICOMPARTMENTAL KNEE (Right)  Patient Location: PACU  Anesthesia Type: MAC and Spinal  Level of Consciousness: awake, alert , oriented and patient cooperative  Airway & Oxygen Therapy: Patient Spontanous Breathing and Patient connected to face mask oxygen  Post-op Assessment: Report given to PACU RN and Post -op Vital signs reviewed and stable  Post vital signs: Reviewed and stable  Complications: No apparent anesthesia complications

## 2011-09-22 NOTE — Op Note (Signed)
NAME: Walter Kim    MEDICAL RECORD NO.: [id number]   FACILITY: [facility]   DATE OF BIRTH: 05/30/1960  PHYSICIAN: Madlyn Frankel. Charlann Boxer, M.D.    DATE OF PROCEDURE: 09/22/2011    OPERATIVE REPORT   PREOPERATIVE DIAGNOSIS: Right knee medial compartment osteoarthritis.   POSTOPERATIVE DIAGNOSIS: Right knee medial compartment osteoarthritis.  PROCEDURE: Right partial knee replacement utilizing Biomet Oxford knee  component, size medium femur, a right medial size B tibial tray with a 4 insert.   SURGEON: Madlyn Frankel. Charlann Boxer, M.D.   ASSISTANT: Lanney Gins, PAC.  Please note that Mr. Walter Kim was present for the entirety of the case,  utilized for preoperative positioning, perioperative retractor  management, general facilitation of the case and primary wound closure.   ANESTHESIA: Spinal.   SPECIMENS: None.   COMPLICATIONS: None.  DRAINS: 1 medium HV   TOURNIQUET TIME: 56 minutes at 250 mmHg.   INDICATIONS FOR PROCEDURE: The patient is a 51 yo male patient of mine who presented for evaluation of right knee pain.  They presented with primary complaints of pain on the medial side of their knee. Radiographs revealed advanced medial compartment arthritis with specifically an antero-medial wear pattern.  There was progressive bone on bone changes noted with subchondral sclerosis and osteophytes present. The patient has had progressive problems failing to respond to conservative measures of medications, injections and activity modification. Risks of infection, DVT, component failure, need for future revision surgery were all discussed and reviewed.  Consent was obtained for benefit of pain relief.   PROCEDURE IN DETAIL: The patient was brought to the operative theater.  Once adequate anesthesia, preoperative antibiotics, 2gm Ancef administered, the patient was positioned in supine position with a right thigh tourniquet  placed. The right lower extremity was prepped and draped in sterile  fashion  with the leg on the Oxford leg holder.  The leg was allowed to flex to 120 degrees. A time-out  was performed identifying the patient, planned procedure, and extremity.  The leg was exsanguinated, tourniquet elevated to 250 mmHg. A midline  incision was made from the proximal pole of the patella to the tibial tubercle. A  soft tissue plane was created and partial median arthrotomy was then  made to allow for subluxation of the patella. Following initial synovectomy and  debridement, the osteophytes were removed off the medial aspect of the  knee.   Attention was first directed to the tibia. The tibial  extramedullary guide was positioned over the anterior crest of the tibia  and pinned into position, and using a measured resection guide from the  Oxford system, a 4 mm resection was made off the proximal tibia. First  the reciprocating saw along the medial aspect of the tibial spines, then the oscillating saw.    At this point, I sized this cut surface seem to be best fit for a size B tibial tray.  With the retractors out of the wound and the knee held at 90 degrees the 4 feeler gauge had appropriate tension on the medial ligament.   At this point, the femoral canal was opened with a drill and the  intramedullary rod passed. Then using the guide for a medium resection off  the posterior aspect of the femur was positioned over the mid portion of the medial femoral condyle.  The orientation was set using the guide that mates the femoral guide to the intramedullary rod.  The 2 drill holes were made into the distal femur.  The posterior guide  was then impacted into place and the posterior  femoral cut made.  At this point, I milled the distal femur with a size 4 spigot in place. At this point, we did a trial reduction of the medium femur, size B tibial tray and a 4 insert. At 90 degrees of  flexion and at 20 degrees of flexion the knee had symmetric tension on  the ligaments.   Given these  findings, the trial femoral component was removed. Final preparation of tibia was carried out by pinning it in position. Then  using a reciprocating saw I removed bone for the keel. Further bone was  removed with an osteotome.  Trial reduction was now carried out with the 4 femur, the keeled B tibia, and a 4 lollipop insert. The balance of the  ligaments appeared to be symmetric at 20 degrees and 90 degrees. Given  all these findings, the trial components were removed.   Cement was mixed. The final components were opened. The knee was irrigated with  normal saline solution. Then final debridements of the  soft tissue was carried out, I also drilled the sclerotic bone with a drill.  The final components were cemented with a single batch of cement in a  two-stage technique with the tibial component cemented first. The knee  was then brought  to 45 degrees of flexion with a 4 feeler gauge, held with pressure for a minute and half.  After this the femoral component was cemented in place.  The knee was again held at 45 degrees of flexion while the cement fully cured.  Excess cement was removed throughout the knee. Tourniquet was let down  after 56 minutes. After the cement had fully cured and excessive cement  was removed throughout the knee there was no visualized cement present.   The final 4 insert was chosen and snapped into position. We re-irrigated  the knee. I placed a medium Hemovac drain deep. The extensor mechanism  was then reapproximated using a #1 Vicryl with the knee in flexion. The  remaining wound was closed with 2-0 Vicryl and a running 4-0 Monocryl.  The knee was cleaned, dried, and dressed sterilely using Dermabond and  Aquacel dressing. The drain site was dressed separately. The patient  was brought to the recovery room, Ace wrap in place, tolerating the  procedure well. He will be in the hospital for overnight observation.  We will initiate physical therapy and progress to  ambulate.     Madlyn Frankel Charlann Boxer, M.D.

## 2011-09-22 NOTE — Anesthesia Procedure Notes (Signed)
Spinal  Patient location during procedure: OR Start time: 09/22/2011 4:55 PM End time: 09/22/2011 5:03 PM Staffing Anesthesiologist: Lucille Passy F Performed by: anesthesiologist  Preanesthetic Checklist Completed: patient identified, site marked, surgical consent, pre-op evaluation, timeout performed, IV checked, risks and benefits discussed and monitors and equipment checked Spinal Block Patient position: sitting Prep: Betadine Patient monitoring: heart rate, continuous pulse ox and blood pressure Approach: midline Location: L2-3 Injection technique: single-shot Needle Needle type: Quincke  Needle gauge: 22 G Needle length: 9 cm Additional Notes Expiration date of kit checked and confirmed. Patient tolerated procedure well, without complications. Negative heme/paresthesia Lot [id number] DOE 12/2012

## 2011-09-23 LAB — BASIC METABOLIC PANEL
BUN: 13 mg/dL (ref 6–23)
Calcium: 9.4 mg/dL (ref 8.4–10.5)
GFR calc Af Amer: >90 mL/min (ref >90)
GFR calc non Af Amer: >90 mL/min (ref >90)
Glucose, Bld: 140 mg/dL — ABNORMAL HIGH (ref 70–99)
Potassium: 5.1 mEq/L (ref 3.5–5.1)
Sodium: 135 mEq/L (ref 135–145)

## 2011-09-23 LAB — CBC
MCH: 31.6 pg (ref 26.0–34.0)
MCHC: 33.6 g/dL (ref 30.0–36.0)
RDW: 13 % (ref 11.5–15.5)

## 2011-09-23 MED ORDER — METHOCARBAMOL 500 MG PO TABS: 500.0000 mg | ORAL_TABLET | Freq: Four times a day (QID) | ORAL | Status: AC | PRN

## 2011-09-23 MED ORDER — POLYETHYLENE GLYCOL 3350 17 G PO PACK: 17.0000 g | PACK | Freq: Two times a day (BID) | ORAL | Status: AC

## 2011-09-23 MED ORDER — DSS 100 MG PO CAPS: 100.0000 mg | ORAL_CAPSULE | Freq: Two times a day (BID) | ORAL | Status: AC

## 2011-09-23 MED ORDER — DIPHENHYDRAMINE HCL 25 MG PO CAPS: 25.0000 mg | ORAL_CAPSULE | Freq: Four times a day (QID) | ORAL | Status: DC | PRN

## 2011-09-23 MED ORDER — HYDROCODONE-ACETAMINOPHEN 7.5-325 MG PO TABS: 1.0000 | ORAL_TABLET | ORAL | Status: AC

## 2011-09-23 MED ORDER — FERROUS SULFATE 325 (65 FE) MG PO TABS: 325.0000 mg | ORAL_TABLET | Freq: Three times a day (TID) | ORAL | Status: AC

## 2011-09-23 MED ORDER — CELECOXIB 200 MG PO CAPS: 200.0000 mg | ORAL_CAPSULE | Freq: Two times a day (BID) | ORAL | Status: AC

## 2011-09-23 MED ORDER — ASPIRIN EC 325 MG PO TBEC: 325.0000 mg | DELAYED_RELEASE_TABLET | Freq: Two times a day (BID) | ORAL | Status: AC

## 2011-09-23 NOTE — Discharge Summary (Signed)
Physician Discharge Summary  Patient ID: Walter Kim MRN: [id number] DOB/AGE: 51-Jul-1962 51 y.o.  Admit date: 09/22/2011 Discharge date: 09/23/2011  Procedures:  Procedure(s) (LRB): UNICOMPARTMENTAL KNEE (Right)  Attending Physician:  Dr. Durene Kim   Admission Diagnoses: Right knee medial compartment OA and pain    Discharge Diagnoses:  Principal Problem:  *S/P right UKR Arthritis   Sleep apnea   HPI: Pt is a 51 y.o. male complaining of right knee pain for 3-4 years. Pain had continually increased since the beginning. X-rays in the clinic show arthritic changes of the right knee, an MRI was obtained which showed medial compartment OA changes. Pt has tried various conservative treatments which have failed to alleviate their symptoms, including steroid injections, hyaluronic acid injections as well as previous athroscopies . Various options are discussed with the patient. Risks, benefits and expectations were discussed with the patient. Patient understand the risks, benefits and expectations and wishes to proceed with surgery.   PCP: No primary provider on file.   Discharged Condition: good  Hospital Course:  Patient underwent the above stated procedure on 09/22/2011. Patient tolerated the procedure well and brought to the recovery room in good condition and subsequently to the floor.  POD #1 BP: 100/59 ; Pulse: 48 ; Temp: 97.5 F (36.4 C) ; Resp: 20  Pt's foley was removed, as well as the hemovac drain removed. IV was changed to a saline lock. Patient reports pain as mild, pain well controlled. No events throughout the night. Ready to be discharged home with home health. Neurovascular intact, dorsiflexion/plantar flexion intact, incision: dressing C/D/I, no cellulitis present and compartment soft.   LABS  Basename  09/23/11 0450   HGB  15.6  HCT  46.4    Discharge Exam: General appearance: alert, cooperative and no distress Extremities: Homans sign is negative, no sign of  DVT, no edema, redness or tenderness in the calves or thighs and no ulcers, gangrene or trophic changes  Disposition:  Home  with follow up in 2 weeks   Discharge Orders    Future Orders Please Complete By Expires   Call MD / Call 911      Comments:   If you experience chest pain or shortness of breath, CALL 911 and be transported to the hospital emergency room.  If you develope a fever above 101 F, pus (white drainage) or increased drainage or redness at the wound, or calf pain, call your surgeon's office.   Discharge instructions      Comments:   Maintain surgical dressing for 8 days, then replace with gauze and tape. Keep the area dry and clean until follow up. [facility] in 2 weeks at [facility]. Call with any questions or concerns.     Constipation Prevention      Comments:   Drink plenty of fluids.  Prune juice may be helpful.  You may use a stool softener, such as Colace (over the counter) 100 mg twice a day.  Use MiraLax (over the counter) for constipation as needed.   Increase activity slowly as tolerated      Driving restrictions      Comments:   No driving for 4 weeks   TED hose      Comments:   Use stockings (TED hose) for 2 weeks on both leg(s).  You may remove them at night for sleeping.   Change dressing      Comments:   Maintain surgical dressing for 8 days, then change the dressing daily  with sterile 4 x 4 inch gauze dressing and tape. Keep the area dry and clean.      Current Discharge Medication List    START taking these medications   Details  aspirin EC 325 MG tablet Take 1 tablet (325 mg total) by mouth 2 (two) times daily. X 4 weeks Qty: 60 tablet, Refills: 0    celecoxib (CELEBREX) 200 MG capsule Take 1 capsule (200 mg total) by mouth 2 (two) times daily. Qty: 60 capsule, Refills: 0    docusate sodium 100 MG CAPS Take 100 mg by mouth 2 (two) times daily.    ferrous sulfate 325 (65 FE) MG tablet Take 1 tablet (325 mg total) by mouth 3  (three) times daily after meals.    HYDROcodone-acetaminophen (NORCO) 7.5-325 MG per tablet Take 1-2 tablets by mouth every 4 (four) hours. Qty: 120 tablet, Refills: 0    methocarbamol (ROBAXIN) 500 MG tablet Take 1 tablet (500 mg total) by mouth every 6 (six) hours as needed (muscle spasms).    polyethylene glycol (MIRALAX / GLYCOLAX) packet Take 17 g by mouth 2 (two) times daily.      CONTINUE these medications which have CHANGED   Details  diphenhydrAMINE (BENADRYL) 25 mg capsule Take 1 capsule (25 mg total) by mouth every 6 (six) hours as needed for itching, allergies or sleep. Qty: 30 capsule      CONTINUE these medications which have NOT CHANGED   Details  cholecalciferol (VITAMIN D) 1000 UNITS tablet Take 1,000 Units by mouth once a week. On mondays       STOP taking these medications     ibuprofen (ADVIL,MOTRIN) 200 MG tablet Comments:  Reason for Stopping:          Follow-up Information    Follow up with Walter Pal, MD. Schedule an appointment as soon as possible for a visit in 2 weeks.   Contact information:   [facility] [address] [place] [address] [phone]          Signed: Anastasio Kim. Walter Kim   PAC  09/23/2011, 10:11 AM

## 2011-09-23 NOTE — Evaluation (Signed)
Physical Therapy Evaluation Patient Details Name: Walter Kim MRN: [id number] DOB: 12/19/1960 Today's Date: 09/23/2011 Time: 0930-1000 PT Time Calculation (min): 30 min  Screened for OT needs as well, pt has no need for acute OT needs at this time.  Pt and wife to complete all ADLS at home.   PT Assessment / Plan / Recommendation Clinical Impression  Pt s/p R uniknee joint replacment and doing very well.  Pt presents with some surgical pain, decreased ROMa nd decreased strenght with some difficultly with ambulation.  To benfit from acute PT to increase safety to transition to home and educate pt/family wiht HEP to continue wat home for R knee replacment.      PT Assessment  Patient needs continued PT services    Follow Up Recommendations  Home health PT;Outpatient PT    Barriers to Discharge        lEquipment Recommendations  None recommended by PT    Recommendations for Other Services     Frequency  (one more time today prior to DC this evening if DC'd by MD)    Precautions / Restrictions Precautions Precautions: Knee Restrictions RLE Weight Bearing: Weight bearing as tolerated   Pertinent Vitals/Pain 124/67 in sitting, HR 51, and pain 2/10 in supine , up to 5/10 with ambulation, but returned to 2/10 once sitting again.  tolerated well.        Mobility  Bed Mobility Bed Mobility: Left Sidelying to Sit Left Sidelying to Sit: 5: Supervision;HOB elevated;With rails Details for Bed Mobility Assistance: educated to move slow to decrease possibility of dizziness with p/o  Transfers Transfers: Sit to Stand Sit to Stand: 4: Min assist;With upper extremity assist;From elevated surface;From bed Details for Transfer Assistance: educated with safety for use of walker and push off Ambulation/Gait Ambulation/Gait Assistance: 4: Min guard Ambulation Distance (Feet): 45 Feet Assistive device: Rolling walker Ambulation/Gait Assistance Details: cues for step to pattern intially to  decrease pain in RLE/knee.   Gait Pattern: Step-to pattern;Decreased step length - left Gait velocity: slow Stairs: No Wheelchair Mobility Wheelchair Mobility: No    Exercises Total Joint Exercises Ankle Circles/Pumps: AROM;Right;10 reps;Supine Quad Sets: AROM;Right;5 reps;Supine Heel Slides: AROM;Right;10 reps;Supine Straight Leg Raises: AROM;Right;5 reps;Supine   PT Diagnosis: Difficulty walking;Acute pain  PT Problem List: Decreased strength;Decreased range of motion;Decreased activity tolerance;Decreased mobility PT Treatment Interventions: DME instruction;Gait training;Functional mobility training;Therapeutic exercise   PT Goals Acute Rehab PT Goals PT Goal Formulation: With patient Time For Goal Achievement: 09/26/11 Potential to Achieve Goals: Good Pt will go Supine/Side to Sit: Independently PT Goal: Supine/Side to Sit - Progress: Goal set today Pt will go Sit to Supine/Side: Independently PT Goal: Sit to Supine/Side - Progress: Goal set today Pt will go Sit to Stand: Independently PT Goal: Sit to Stand - Progress: Goal set today Pt will go Stand to Sit: Independently PT Goal: Stand to Sit - Progress: Goal set today Pt will Ambulate: 51 - 150 feet;with supervision;with rolling walker PT Goal: Ambulate - Progress: Goal set today Pt will Perform Home Exercise Program: with supervision, verbal cues required/provided PT Goal: Perform Home Exercise Program - Progress: Goal set today  Visit Information  Last PT Received On: 09/23/11 Assistance Needed: +1    Subjective Data  Subjective: Pt stated, I am doing much better than when I had my hip replaced, less nauseated, etc.  I am feeling pretty good and looking forward to getting home.  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   Prior Functioning  Home Living Lives With: Spouse Available Help at Discharge: Family;Available 24 hours/day Type of Home: House Home Access: Ramped  entrance Home Layout: Multi-level;Able to live on main level with bedroom/bathroom Alternate Level Stairs-Number of Steps: 4 level home, but has elevator and bed on entry level as well. Bathroom Toilet: Handicapped height Bathroom Accessibility: Yes Home Adaptive Equipment: Walker - rolling Additional Comments: just had recent hip replacment so has equipment at home.  Prior Function Level of Independence: Independent Able to Take Stairs?: Yes Driving: Yes Vocation: Full time employment Communication Communication: No difficulties    Cognition  Overall Cognitive Status: Appears within functional limits for tasks assessed/performed    Extremity/Trunk Assessment     Balance    End of Session PT - End of Session Equipment Utilized During Treatment: Gait belt Activity Tolerance: Patient tolerated treatment well Patient left: in chair;with call bell/phone within reach;with family/visitor present   Marella Bile 09/23/2011, 11:07 AM  Marella Bile, PT Pager: [phone] 09/23/2011

## 2011-09-23 NOTE — Progress Notes (Addendum)
Subjective: 1 Day Post-Op Procedure(s) (LRB): UNICOMPARTMENTAL KNEE (Right)   Patient reports pain as mild, pain well controlled. No events throughout the night. Ready to be discharged home with home health.  Objective:   VITALS:   Filed Vitals:   09/23/11 0759  BP: 100/59  Pulse: 48  Temp: 97.5 F (36.4 C)  Resp: 20    Neurovascular intact Dorsiflexion/Plantar flexion intact Incision: dressing C/D/I No cellulitis present Compartment soft  LABS  Basename 09/23/11 0450  HGB 15.6  HCT 46.4  WBC 10.9*  PLT 209     Basename 09/23/11 0450  NA 135  K 5.1  BUN 13  CREATININE 0.92  GLUCOSE 140*     Assessment/Plan: 1 Day Post-Op Procedure(s) (LRB): UNICOMPARTMENTAL KNEE (Right)   HV drain d/c'ed ACE d/c'ed Foley cath d/c'ed Advance diet Up with therapy D/C IV fluids Discharge home with home health after PT   Anastasio Auerbach. Taleya Whitcher   PAC  09/23/2011, 10:05 AM

## 2011-09-23 NOTE — Progress Notes (Signed)
Patient discharge home with wife, alert and oriented, discharge instructions given patient verbalize understanding of orders given, patient in stable condition at this time

## 2011-09-24 ENCOUNTER — Encounter (HOSPITAL_COMMUNITY): Payer: Self-pay | Admitting: Orthopedic Surgery

## 2011-09-25 NOTE — Anesthesia Postprocedure Evaluation (Signed)
Anesthesia Post Note  Patient: Walter Kim  Procedure(s) Performed: Procedure(s) (LRB): UNICOMPARTMENTAL KNEE (Right)  Anesthesia type: Spinal  Patient location: PACU  Post pain: Pain level controlled  Post assessment: Post-op Vital signs reviewed  Last Vitals:  Filed Vitals:   09/23/11 1200  BP:   Pulse:   Temp:   Resp: 20    Post vital signs: Reviewed  Level of consciousness: sedated  Complications: No apparent anesthesia complications

## 2012-09-14 LAB — HM HEPATITIS C SCREENING LAB: HM HEPATITIS C SCREENING: NEGATIVE

## 2012-11-01 LAB — HM COLONOSCOPY

## 2015-06-18 ENCOUNTER — Encounter: Payer: Self-pay | Admitting: Podiatry

## 2015-06-18 ENCOUNTER — Ambulatory Visit (INDEPENDENT_AMBULATORY_CARE_PROVIDER_SITE_OTHER): Payer: BLUE CROSS/BLUE SHIELD | Admitting: Podiatry

## 2015-06-18 ENCOUNTER — Ambulatory Visit (INDEPENDENT_AMBULATORY_CARE_PROVIDER_SITE_OTHER): Payer: BLUE CROSS/BLUE SHIELD

## 2015-06-18 VITALS — BP 117/73 | HR 57 | Resp 18

## 2015-06-18 DIAGNOSIS — R52 Pain, unspecified: Secondary | ICD-10-CM

## 2015-06-18 DIAGNOSIS — M7731 Calcaneal spur, right foot: Secondary | ICD-10-CM | POA: Diagnosis not present

## 2015-06-18 DIAGNOSIS — M722 Plantar fascial fibromatosis: Secondary | ICD-10-CM

## 2015-06-18 MED ORDER — MELOXICAM 15 MG PO TABS: 15.0000 mg | ORAL_TABLET | Freq: Every day | ORAL | Status: DC

## 2015-06-18 MED ORDER — METHYLPREDNISOLONE 4 MG PO TBPK: ORAL_TABLET | ORAL | Status: DC

## 2015-06-18 NOTE — Progress Notes (Signed)
   Subjective:    Patient ID: Walter Kim, male    DOB: 1960/12/11, 55 y.o.   MRN: [id number]  HPI  55 year old male presents to the office today for concerns of right arch and heel pain which started 2.5 weeks ago after he purchased new shoes. He has gone back to his old shoes and it has been somewhat better. The pain is worse in the morning when he gets up and the pain goes away once he walks some. No tingling or numbness. No swelling or redness. No injury. He states when he brings is right foot up he feels a pulling sensation. No other complaints at this time.   He is going to the [facility] tournament this week.   Review of Systems  All other systems reviewed and are negative.      Objective:   Physical Exam General: AAO x3, NAD  Dermatological: Skin is warm, dry and supple bilateral. Nails x 10 are well manicured; remaining integument appears unremarkable at this time. There are no open sores, no preulcerative lesions, no rash or signs of infection present.  Vascular: Dorsalis Pedis artery and Posterior Tibial artery pedal pulses are 2/4 bilateral with immedate capillary fill time. Pedal hair growth present. No varicosities and no lower extremity edema present bilateral. There is no pain with calf compression, swelling, warmth, erythema.   Neruologic: Grossly intact via light touch bilateral. Vibratory intact via tuning fork bilateral. Protective threshold with Semmes Wienstein monofilament intact to all pedal sites bilateral. Patellar and Achilles deep tendon reflexes 2+ bilateral. No Babinski or clonus noted bilateral.   Musculoskeletal: Tenderness to palpation along the plantar medial tubercle of the calcaneus at the insertion of plantar fascia on the right foot. There is no pain along the course of the plantar fascia within the arch of the foot. Plantar fascia appears to be intact. There is no pain with lateral compression of the calcaneus or pain with vibratory sensation. There is no  pain along the course or insertion of the achilles tendon. No other areas of tenderness to bilateral lower extremities. MMT 5/5, ROM WNL.   Gait: Unassisted, Nonantalgic.      Assessment & Plan:  55 year old male with right plantar fasciitis/heel spur -Treatment options discussed including all alternatives, risks, and complications -Etiology of symptoms were discussed -X-rays were obtained and reviewed with the patient.  There is no definitive evidence of acute fracture stress fracture sign. Heel spurs present. -Patient elects to proceed with steroid injection into the right heel. Under sterile skin preparation, a total of 2.5cc of kenalog 10, 0.5% Marcaine plain, and 2% lidocaine plain were infiltrated into the symptomatic area without complication. A band-aid was applied. Patient tolerated the injection well without complication. Post-injection care with discussed with the patient. Discussed with the patient to ice the area over the next couple of days to help prevent a steroid flare.  -Medrol dose pack -Mobic once medrol is complete -Plantar fascial brace -Supportive shoegear -Follow-up in 3 weeks or sooner if any problems arise. In the meantime, encouraged to call the office with any questions, concerns, change in symptoms.   Ovid Curd, DPM

## 2015-06-18 NOTE — Patient Instructions (Signed)
Start with medrol dose pack. Once this is complete you can start mobic  Plantar Fasciitis With Rehab The plantar fascia is a fibrous, ligament-like, soft-tissue structure that spans the bottom of the foot. Plantar fasciitis, also called heel spur syndrome, is a condition that causes pain in the foot due to inflammation of the tissue. SYMPTOMS   Pain and tenderness on the underneath side of the foot.  Pain that worsens with standing or walking. CAUSES  Plantar fasciitis is caused by irritation and injury to the plantar fascia on the underneath side of the foot. Common mechanisms of injury include:  Direct trauma to bottom of the foot.  Damage to a small nerve that runs under the foot where the main fascia attaches to the heel bone.  Stress placed on the plantar fascia due to bone spurs. RISK INCREASES WITH:   Activities that place stress on the plantar fascia (running, jumping, pivoting, or cutting).  Poor strength and flexibility.  Improperly fitted shoes.  Tight calf muscles.  Flat feet.  Failure to warm-up properly before activity.  Obesity. PREVENTION  Warm up and stretch properly before activity.  Allow for adequate recovery between workouts.  Maintain physical fitness:  Strength, flexibility, and endurance.  Cardiovascular fitness.  Maintain a health body weight.  Avoid stress on the plantar fascia.  Wear properly fitted shoes, including arch supports for individuals who have flat feet. PROGNOSIS  If treated properly, then the symptoms of plantar fasciitis usually resolve without surgery. However, occasionally surgery is necessary. RELATED COMPLICATIONS   Recurrent symptoms that may result in a chronic condition.  Problems of the lower back that are caused by compensating for the injury, such as limping.  Pain or weakness of the foot during push-off following surgery.  Chronic inflammation, scarring, and partial or complete fascia tear, occurring more  often from repeated injections. TREATMENT  Treatment initially involves the use of ice and medication to help reduce pain and inflammation. The use of strengthening and stretching exercises may help reduce pain with activity, especially stretches of the Achilles tendon. These exercises may be performed at home or with a therapist. Your caregiver may recommend that you use heel cups of arch supports to help reduce stress on the plantar fascia. Occasionally, corticosteroid injections are given to reduce inflammation. If symptoms persist for greater than 6 months despite non-surgical (conservative), then surgery may be recommended.  MEDICATION   If pain medication is necessary, then nonsteroidal anti-inflammatory medications, such as aspirin and ibuprofen, or other minor pain relievers, such as acetaminophen, are often recommended.  Do not take pain medication within 7 days before surgery.  Prescription pain relievers may be given if deemed necessary by your caregiver. Use only as directed and only as much as you need.  Corticosteroid injections may be given by your caregiver. These injections should be reserved for the most serious cases, because they may only be given a certain number of times. HEAT AND COLD  Cold treatment (icing) relieves pain and reduces inflammation. Cold treatment should be applied for 10 to 15 minutes every 2 to 3 hours for inflammation and pain and immediately after any activity that aggravates your symptoms. Use ice packs or massage the area with a piece of ice (ice massage).  Heat treatment may be used prior to performing the stretching and strengthening activities prescribed by your caregiver, physical therapist, or athletic trainer. Use a heat pack or soak the injury in warm water. SEEK IMMEDIATE MEDICAL CARE IF:  Treatment seems to  offer no benefit, or the condition worsens.  Any medications produce adverse side effects. EXERCISES RANGE OF MOTION (ROM) AND  STRETCHING EXERCISES - Plantar Fasciitis (Heel Spur Syndrome) These exercises may help you when beginning to rehabilitate your injury. Your symptoms may resolve with or without further involvement from your physician, physical therapist or athletic trainer. While completing these exercises, remember:   Restoring tissue flexibility helps normal motion to return to the joints. This allows healthier, less painful movement and activity.  An effective stretch should be held for at least 30 seconds.  A stretch should never be painful. You should only feel a gentle lengthening or release in the stretched tissue. RANGE OF MOTION - Toe Extension, Flexion  Sit with your right / left leg crossed over your opposite knee.  Grasp your toes and gently pull them back toward the top of your foot. You should feel a stretch on the bottom of your toes and/or foot.  Hold this stretch for __________ seconds.  Now, gently pull your toes toward the bottom of your foot. You should feel a stretch on the top of your toes and or foot.  Hold this stretch for __________ seconds. Repeat __________ times. Complete this stretch __________ times per day.  RANGE OF MOTION - Ankle Dorsiflexion, Active Assisted  Remove shoes and sit on a chair that is preferably not on a carpeted surface.  Place right / left foot under knee. Extend your opposite leg for support.  Keeping your heel down, slide your right / left foot back toward the chair until you feel a stretch at your ankle or calf. If you do not feel a stretch, slide your bottom forward to the edge of the chair, while still keeping your heel down.  Hold this stretch for __________ seconds. Repeat __________ times. Complete this stretch __________ times per day.  STRETCH - Gastroc, Standing  Place hands on wall.  Extend right / left leg, keeping the front knee somewhat bent.  Slightly point your toes inward on your back foot.  Keeping your right / left heel on the  floor and your knee straight, shift your weight toward the wall, not allowing your back to arch.  You should feel a gentle stretch in the right / left calf. Hold this position for __________ seconds. Repeat __________ times. Complete this stretch __________ times per day. STRETCH - Soleus, Standing  Place hands on wall.  Extend right / left leg, keeping the other knee somewhat bent.  Slightly point your toes inward on your back foot.  Keep your right / left heel on the floor, bend your back knee, and slightly shift your weight over the back leg so that you feel a gentle stretch deep in your back calf.  Hold this position for __________ seconds. Repeat __________ times. Complete this stretch __________ times per day. STRETCH - Gastrocsoleus, Standing  Note: This exercise can place a lot of stress on your foot and ankle. Please complete this exercise only if specifically instructed by your caregiver.   Place the ball of your right / left foot on a step, keeping your other foot firmly on the same step.  Hold on to the wall or a rail for balance.  Slowly lift your other foot, allowing your body weight to press your heel down over the edge of the step.  You should feel a stretch in your right / left calf.  Hold this position for __________ seconds.  Repeat this exercise with a slight bend  in your right / left knee. Repeat __________ times. Complete this stretch __________ times per day.  STRENGTHENING EXERCISES - Plantar Fasciitis (Heel Spur Syndrome)  These exercises may help you when beginning to rehabilitate your injury. They may resolve your symptoms with or without further involvement from your physician, physical therapist or athletic trainer. While completing these exercises, remember:   Muscles can gain both the endurance and the strength needed for everyday activities through controlled exercises.  Complete these exercises as instructed by your physician, physical therapist or  athletic trainer. Progress the resistance and repetitions only as guided. STRENGTH - Towel Curls  Sit in a chair positioned on a non-carpeted surface.  Place your foot on a towel, keeping your heel on the floor.  Pull the towel toward your heel by only curling your toes. Keep your heel on the floor.  If instructed by your physician, physical therapist or athletic trainer, add ____________________ at the end of the towel. Repeat __________ times. Complete this exercise __________ times per day. STRENGTH - Ankle Inversion  Secure one end of a rubber exercise band/tubing to a fixed object (table, pole). Loop the other end around your foot just before your toes.  Place your fists between your knees. This will focus your strengthening at your ankle.  Slowly, pull your big toe up and in, making sure the band/tubing is positioned to resist the entire motion.  Hold this position for __________ seconds.  Have your muscles resist the band/tubing as it slowly pulls your foot back to the starting position. Repeat __________ times. Complete this exercises __________ times per day.    This information is not intended to replace advice given to you by your health care provider. Make sure you discuss any questions you have with your health care provider.   Document Released: 04/06/2005 Document Revised: 08/21/2014 Document Reviewed: 07/19/2008 Elsevier Interactive Patient Education [employer]

## 2015-07-09 ENCOUNTER — Ambulatory Visit (INDEPENDENT_AMBULATORY_CARE_PROVIDER_SITE_OTHER): Payer: BLUE CROSS/BLUE SHIELD | Admitting: Podiatry

## 2015-07-09 ENCOUNTER — Encounter: Payer: Self-pay | Admitting: Podiatry

## 2015-07-09 VITALS — BP 107/66 | HR 90 | Resp 18

## 2015-07-09 DIAGNOSIS — M722 Plantar fascial fibromatosis: Secondary | ICD-10-CM

## 2015-07-09 NOTE — Progress Notes (Signed)
Patient ID: Walter Kim, male   DOB: 04/30/1960, 55 y.o.   MRN: [id number]  Subjective: Walter CopaAlbert L Bayless presents to the office today for follow-up evaluation of right arch and heel pain. He states he is doing significantly better than when he was last appointment. He did finish the steroid however he has not taken anti-inflammatory as he has not had any pain. He has continued the plantar fascial brace as well as stretching icing daily. He is able to wear regular shoes without any problems and performing daily activities and working without any pain. No other complaints at this time. No acute changes since last appointment. They deny any systemic complaints such as fevers, chills, nausea, vomiting.  Objective: General: AAO x3, NAD  Dermatological: Skin is warm, dry and supple bilateral. Nails x 10 are well manicured; remaining integument appears unremarkable at this time. There are no open sores, no preulcerative lesions, no rash or signs of infection present.  Vascular: Dorsalis Pedis artery and Posterior Tibial artery pedal pulses are 2/4 bilateral with immedate capillary fill time. Pedal hair growth present. There is no pain with calf compression, swelling, warmth, erythema.   Neruologic: Grossly intact via light touch bilateral. Vibratory intact via tuning fork bilateral. Protective threshold with Semmes Wienstein monofilament intact to all pedal sites bilateral.   Musculoskeletal: There is currently no tenderness to palpation along the plantar medial tubercle of the calcaneus at the insertion of the plantar fascia on the right foot. There is no pain along the course of the plantar fascia within the arch of the foot. Plantar fascia appears to be intact bilaterally. There is no pain with lateral compression of the calcaneus and there is no pain with vibratory sensation. There is no pain along the course or insertion of the Achilles tendon. There are no other areas of tenderness to bilateral lower  extremities. No gross boney pedal deformities bilateral. No pain, crepitus, or limitation noted with foot and ankle range of motion bilateral. Muscular strength 5/5 in all groups tested bilateral.  Gait: Unassisted, Nonantalgic.   Assessment: Presents for follow-up evaluation for heel pain, likely plantar fasciitis with resolved pain.  Plan: -Treatment options discussed including all alternatives, risks, and complications -Discussed with her steroid injection however he is not having pain at this time so we will hold off. -Continue stretching, icing exercises daily as well as supportive shoe gear. Continue plantar fascial brace. Discussed orthotics. He'll look at purchasing an over-the-counter pair. -Follow-up as needed or if there is any reoccurrence. In the meantime, encouraged to call the office with any questions, concerns, change in symptoms.   Ovid CurdMatthew Reginae Wolfrey, DPM

## 2015-07-16 ENCOUNTER — Telehealth: Payer: Self-pay | Admitting: Family Medicine

## 2015-07-16 NOTE — Telephone Encounter (Signed)
Informed pt as below. Emily Drozdowski, CMA  

## 2015-07-16 NOTE — Telephone Encounter (Signed)
Patient called in this morning wanting to know if he should continue prophylactic antibiotics before dental procedures.  His dentist recommended he as Dr. Sullivan LoneGilbert.  [phone]

## 2015-07-16 NOTE — Telephone Encounter (Signed)
yes

## 2016-02-26 DIAGNOSIS — Z23 Encounter for immunization: Secondary | ICD-10-CM | POA: Diagnosis not present

## 2016-05-01 ENCOUNTER — Other Ambulatory Visit: Payer: Self-pay | Admitting: Orthopedic Surgery

## 2016-05-01 ENCOUNTER — Ambulatory Visit (HOSPITAL_COMMUNITY)
Admission: RE | Admit: 2016-05-01 | Discharge: 2016-05-01 | Disposition: A | Discharge status: Home / Self Care | Payer: BLUE CROSS/BLUE SHIELD | Source: Ambulatory Visit | Attending: Orthopedic Surgery | Admitting: Orthopedic Surgery

## 2016-05-01 DIAGNOSIS — M25571 Pain in right ankle and joints of right foot: Secondary | ICD-10-CM | POA: Diagnosis not present

## 2016-05-01 DIAGNOSIS — S99911A Unspecified injury of right ankle, initial encounter: Secondary | ICD-10-CM | POA: Diagnosis not present

## 2016-05-01 DIAGNOSIS — S86011A Strain of right Achilles tendon, initial encounter: Secondary | ICD-10-CM

## 2016-05-01 DIAGNOSIS — M65871 Other synovitis and tenosynovitis, right ankle and foot: Secondary | ICD-10-CM | POA: Diagnosis not present

## 2016-05-01 DIAGNOSIS — R609 Edema, unspecified: Secondary | ICD-10-CM | POA: Insufficient documentation

## 2016-05-05 ENCOUNTER — Encounter
Admission: RE | Admit: 2016-05-05 | Discharge: 2016-05-05 | Disposition: A | Discharge status: Home / Self Care | Payer: BLUE CROSS/BLUE SHIELD | Source: Ambulatory Visit | Attending: Podiatry | Admitting: Podiatry

## 2016-05-05 DIAGNOSIS — Y939 Activity, unspecified: Secondary | ICD-10-CM | POA: Diagnosis not present

## 2016-05-05 DIAGNOSIS — S86011A Strain of right Achilles tendon, initial encounter: Secondary | ICD-10-CM | POA: Diagnosis not present

## 2016-05-05 DIAGNOSIS — Z96651 Presence of right artificial knee joint: Secondary | ICD-10-CM | POA: Diagnosis not present

## 2016-05-05 DIAGNOSIS — Z96642 Presence of left artificial hip joint: Secondary | ICD-10-CM | POA: Diagnosis not present

## 2016-05-05 DIAGNOSIS — Z79899 Other long term (current) drug therapy: Secondary | ICD-10-CM | POA: Diagnosis not present

## 2016-05-05 DIAGNOSIS — G4730 Sleep apnea, unspecified: Secondary | ICD-10-CM | POA: Diagnosis not present

## 2016-05-05 DIAGNOSIS — X58XXXA Exposure to other specified factors, initial encounter: Secondary | ICD-10-CM | POA: Diagnosis not present

## 2016-05-05 LAB — DIFFERENTIAL
Basophils Absolute: 0.1 10*3/uL (ref 0–0.1)
Basophils Relative: 1 %
Eosinophils Absolute: 0.1 10*3/uL (ref 0–0.7)
Eosinophils Relative: 1 %
LYMPHS ABS: 2.4 10*3/uL (ref 1.0–3.6)
LYMPHS PCT: 29 %
MONO ABS: 0.7 10*3/uL (ref 0.2–1.0)
MONOS PCT: 9 %
NEUTROS ABS: 5 10*3/uL (ref 1.4–6.5)
Neutrophils Relative %: 60 %

## 2016-05-05 LAB — CBC
HEMATOCRIT: 46.1 % (ref 40.0–52.0)
HEMOGLOBIN: 15.8 g/dL (ref 13.0–18.0)
MCH: 32.1 pg (ref 26.0–34.0)
MCHC: 34.3 g/dL (ref 32.0–36.0)
MCV: 93.5 fL (ref 80.0–100.0)
Platelets: 216 10*3/uL (ref 150–440)
RBC: 4.93 MIL/uL (ref 4.40–5.90)
RDW: 14.7 % — ABNORMAL HIGH (ref 11.5–14.5)
WBC: 8.2 10*3/uL (ref 3.8–10.6)

## 2016-05-05 NOTE — Patient Instructions (Signed)
  Your procedure is scheduled on: May 07, 2016 (Thursday) Report to Same Day Surgery 2nd floor medical mall [facility](Medical Mall Entrance-take elevator on left to 2nd floor.  Check in with surgery information desk.) To find out your arrival time please call [phone] between 1PM - 3PM on May 06, 2016 (Wednesday)  Remember: Instructions that are not followed completely may result in serious medical risk, up to and including death, or upon the discretion of your surgeon and anesthesiologist your surgery may need to be rescheduled.    _x___ 1. Do not eat food or drink liquids after midnight. No gum chewing or hard candies.     __x__ 2. No Alcohol for 24 hours before or after surgery.   __x__3. No Smoking for 24 prior to surgery.   ____  4. Bring all medications with you on the day of surgery if instructed.    __x__ 5. Notify your doctor if there is any change in your medical condition     (cold, fever, infections).     Do not wear jewelry, make-up, hairpins, clips or nail polish.  Do not wear lotions, powders, or perfumes. You may wear deodorant.  Do not shave 48 hours prior to surgery. Men may shave face and neck.  Do not bring valuables to the hospital.    [facility] is not responsible for any belongings or valuables.               Contacts, dentures or bridgework may not be worn into surgery.  Leave your suitcase in the car. After surgery it may be brought to your room.  For patients admitted to the hospital, discharge time is determined by your treatment team.   Patients discharged the day of surgery will not be allowed to drive home.  You will need someone to drive you home and stay with you the night of your procedure.    Please read over the following fact sheets that you were given:   [facility] and or MRSA Information   __ Take these medicines the morning of surgery with A SIP OF WATER:    1.   2.  3.  4.  5.  6.  ____Fleets enema or  Magnesium Citrate as directed.   _x___ Use CHG Soap or sage wipes as directed on instruction sheet   ____ Use inhalers on the day of surgery and bring to hospital day of surgery  ____ Stop metformin 2 days prior to surgery    ____ Take 1/2 of usual insulin dose the night before surgery and none on the morning of surgery            ____ Stop Aspirin, Coumadin, Pllavix ,Eliquis, Effient, or Pradaxa  x__ Stop Anti-inflammatories such as Advil, Aleve, Ibuprofen, Motrin, Naproxen,          Naprosyn, Goodies powders or aspirin products. Ok to take Tylenol.   ____ Stop supplements until after surgery.    ____ Bring C-Pap to the hospital.

## 2016-05-07 ENCOUNTER — Ambulatory Visit
Admission: RE | Admit: 2016-05-07 | Discharge: 2016-05-07 | Disposition: A | Discharge status: Home / Self Care | Payer: BLUE CROSS/BLUE SHIELD | Source: Ambulatory Visit | Attending: Podiatry | Admitting: Podiatry

## 2016-05-07 ENCOUNTER — Ambulatory Visit: Payer: BLUE CROSS/BLUE SHIELD | Admitting: Anesthesiology

## 2016-05-07 ENCOUNTER — Encounter
Admission: RE | Disposition: A | Discharge status: Home / Self Care | Payer: Self-pay | Source: Ambulatory Visit | Attending: Podiatry

## 2016-05-07 DIAGNOSIS — Z79899 Other long term (current) drug therapy: Secondary | ICD-10-CM | POA: Insufficient documentation

## 2016-05-07 DIAGNOSIS — G8918 Other acute postprocedural pain: Secondary | ICD-10-CM | POA: Diagnosis not present

## 2016-05-07 DIAGNOSIS — S86011A Strain of right Achilles tendon, initial encounter: Secondary | ICD-10-CM | POA: Diagnosis not present

## 2016-05-07 DIAGNOSIS — M25571 Pain in right ankle and joints of right foot: Secondary | ICD-10-CM | POA: Diagnosis not present

## 2016-05-07 DIAGNOSIS — Z96651 Presence of right artificial knee joint: Secondary | ICD-10-CM | POA: Diagnosis not present

## 2016-05-07 DIAGNOSIS — X58XXXA Exposure to other specified factors, initial encounter: Secondary | ICD-10-CM | POA: Insufficient documentation

## 2016-05-07 DIAGNOSIS — Y939 Activity, unspecified: Secondary | ICD-10-CM | POA: Insufficient documentation

## 2016-05-07 DIAGNOSIS — G4730 Sleep apnea, unspecified: Secondary | ICD-10-CM | POA: Insufficient documentation

## 2016-05-07 DIAGNOSIS — Z96642 Presence of left artificial hip joint: Secondary | ICD-10-CM | POA: Insufficient documentation

## 2016-05-07 DIAGNOSIS — M79604 Pain in right leg: Secondary | ICD-10-CM | POA: Diagnosis not present

## 2016-05-07 HISTORY — PX: ACHILLES TENDON SURGERY: SHX542

## 2016-05-07 SURGERY — REPAIR, TENDON, ACHILLES
Anesthesia: General | Laterality: Right

## 2016-05-07 MED ORDER — ROCURONIUM BROMIDE 50 MG/5ML IV SOSY
PREFILLED_SYRINGE | INTRAVENOUS | Status: AC
  Filled 2016-05-07: qty 5

## 2016-05-07 MED ORDER — SCOPOLAMINE 1 MG/3DAYS TD PT72
1.0000 | MEDICATED_PATCH | TRANSDERMAL | Status: DC
  Administered 2016-05-07: 1.5 mg via TRANSDERMAL

## 2016-05-07 MED ORDER — SEVOFLURANE IN SOLN
RESPIRATORY_TRACT | Status: AC
  Filled 2016-05-07: qty 250

## 2016-05-07 MED ORDER — PROPOFOL 500 MG/50ML IV EMUL
INTRAVENOUS | Status: DC | PRN
Start: 2016-05-07 — End: 2016-05-07
  Administered 2016-05-07: 150 ug/kg/min via INTRAVENOUS

## 2016-05-07 MED ORDER — OXYCODONE HCL 5 MG PO TABS
ORAL_TABLET | ORAL | Status: AC
  Filled 2016-05-07: qty 1

## 2016-05-07 MED ORDER — PROPOFOL 10 MG/ML IV BOLUS
INTRAVENOUS | Status: AC
  Filled 2016-05-07: qty 20

## 2016-05-07 MED ORDER — FAMOTIDINE 20 MG PO TABS
ORAL_TABLET | ORAL | Status: AC
  Administered 2016-05-07: 20 mg via ORAL
  Filled 2016-05-07: qty 1

## 2016-05-07 MED ORDER — LIDOCAINE HCL (PF) 1 % IJ SOLN
INTRAMUSCULAR | Status: DC | PRN
  Administered 2016-05-07 (×2): 1 mL via INTRADERMAL

## 2016-05-07 MED ORDER — SUCCINYLCHOLINE CHLORIDE 200 MG/10ML IV SOSY
PREFILLED_SYRINGE | INTRAVENOUS | Status: AC
  Filled 2016-05-07: qty 10

## 2016-05-07 MED ORDER — CEFAZOLIN SODIUM-DEXTROSE 2-4 GM/100ML-% IV SOLN
INTRAVENOUS | Status: AC
  Filled 2016-05-07: qty 100

## 2016-05-07 MED ORDER — OXYCODONE HCL 5 MG PO TABS
5.0000 mg | ORAL_TABLET | Freq: Once | ORAL | Status: AC | PRN
  Administered 2016-05-07: 5 mg via ORAL

## 2016-05-07 MED ORDER — LACTATED RINGERS IV SOLN
INTRAVENOUS | Status: DC | PRN
  Administered 2016-05-07: 12:00:00 via INTRAVENOUS

## 2016-05-07 MED ORDER — SUCCINYLCHOLINE CHLORIDE 20 MG/ML IJ SOLN
INTRAMUSCULAR | Status: DC | PRN
  Administered 2016-05-07: 120 mg via INTRAVENOUS

## 2016-05-07 MED ORDER — BUPIVACAINE HCL (PF) 0.25 % IJ SOLN
INTRAMUSCULAR | Status: DC | PRN
  Administered 2016-05-07: 3.5 mL

## 2016-05-07 MED ORDER — LACTATED RINGERS IV SOLN
INTRAVENOUS | Status: DC
  Administered 2016-05-07 (×2): via INTRAVENOUS

## 2016-05-07 MED ORDER — SUGAMMADEX SODIUM 200 MG/2ML IV SOLN
INTRAVENOUS | Status: AC
  Filled 2016-05-07: qty 2

## 2016-05-07 MED ORDER — ROCURONIUM BROMIDE 100 MG/10ML IV SOLN
INTRAVENOUS | Status: DC | PRN
Start: 2016-05-07 — End: 2016-05-07
  Administered 2016-05-07: 30 mg via INTRAVENOUS
  Administered 2016-05-07: 20 mg via INTRAVENOUS

## 2016-05-07 MED ORDER — FENTANYL CITRATE (PF) 100 MCG/2ML IJ SOLN
INTRAMUSCULAR | Status: AC
  Filled 2016-05-07: qty 2

## 2016-05-07 MED ORDER — LIDOCAINE HCL (CARDIAC) 20 MG/ML IV SOLN
INTRAVENOUS | Status: DC | PRN
  Administered 2016-05-07: 100 mg via INTRAVENOUS

## 2016-05-07 MED ORDER — MIDAZOLAM HCL 2 MG/2ML IJ SOLN
INTRAMUSCULAR | Status: AC
  Administered 2016-05-07: 1 mg
  Filled 2016-05-07: qty 2

## 2016-05-07 MED ORDER — BUPIVACAINE HCL (PF) 0.25 % IJ SOLN
INTRAMUSCULAR | Status: AC
  Filled 2016-05-07: qty 30

## 2016-05-07 MED ORDER — ROPIVACAINE HCL 5 MG/ML IJ SOLN
INTRAMUSCULAR | Status: DC | PRN
  Administered 2016-05-07: 10 mL via PERINEURAL
  Administered 2016-05-07: 20 mL via PERINEURAL
  Administered 2016-05-07: 10 mL via PERINEURAL

## 2016-05-07 MED ORDER — LIDOCAINE HCL (PF) 2 % IJ SOLN
INTRAMUSCULAR | Status: AC
  Filled 2016-05-07: qty 2

## 2016-05-07 MED ORDER — MIDAZOLAM HCL 2 MG/2ML IJ SOLN
INTRAMUSCULAR | Status: AC
  Filled 2016-05-07: qty 2

## 2016-05-07 MED ORDER — ROPIVACAINE HCL 5 MG/ML IJ SOLN
INTRAMUSCULAR | Status: AC
  Filled 2016-05-07: qty 40

## 2016-05-07 MED ORDER — ACETAMINOPHEN 10 MG/ML IV SOLN
INTRAVENOUS | Status: AC
  Filled 2016-05-07: qty 100

## 2016-05-07 MED ORDER — ROCURONIUM BROMIDE 50 MG/5ML IV SOSY
PREFILLED_SYRINGE | INTRAVENOUS | Status: AC
Start: 2016-05-07 — End: 2016-05-07
  Filled 2016-05-07: qty 5

## 2016-05-07 MED ORDER — MIDAZOLAM HCL 2 MG/2ML IJ SOLN: 1.0000 mg | Freq: Once | INTRAMUSCULAR | Status: DC

## 2016-05-07 MED ORDER — ONDANSETRON HCL 4 MG/2ML IJ SOLN
INTRAMUSCULAR | Status: AC
  Filled 2016-05-07: qty 2

## 2016-05-07 MED ORDER — FENTANYL CITRATE (PF) 100 MCG/2ML IJ SOLN: 50.0000 ug | Freq: Once | INTRAMUSCULAR | Status: DC

## 2016-05-07 MED ORDER — OXYCODONE HCL 5 MG/5ML PO SOLN: 5.0000 mg | Freq: Once | ORAL | Status: AC | PRN

## 2016-05-07 MED ORDER — FENTANYL CITRATE (PF) 100 MCG/2ML IJ SOLN
INTRAMUSCULAR | Status: DC | PRN
  Administered 2016-05-07: 25 ug via INTRAVENOUS

## 2016-05-07 MED ORDER — DEXAMETHASONE SODIUM PHOSPHATE 10 MG/ML IJ SOLN
INTRAMUSCULAR | Status: DC | PRN
  Administered 2016-05-07: 10 mg via INTRAVENOUS

## 2016-05-07 MED ORDER — FENTANYL CITRATE (PF) 100 MCG/2ML IJ SOLN
25.0000 ug | INTRAMUSCULAR | Status: DC | PRN
  Administered 2016-05-07: 50 ug via INTRAVENOUS
  Administered 2016-05-07: 25 ug via INTRAVENOUS

## 2016-05-07 MED ORDER — SCOPOLAMINE 1 MG/3DAYS TD PT72
MEDICATED_PATCH | TRANSDERMAL | Status: AC
  Administered 2016-05-07: 1.5 mg via TRANSDERMAL
  Filled 2016-05-07: qty 1

## 2016-05-07 MED ORDER — PROPOFOL 500 MG/50ML IV EMUL
INTRAVENOUS | Status: AC
  Filled 2016-05-07: qty 150

## 2016-05-07 MED ORDER — DEXAMETHASONE SODIUM PHOSPHATE 10 MG/ML IJ SOLN
INTRAMUSCULAR | Status: AC
  Filled 2016-05-07: qty 1

## 2016-05-07 MED ORDER — PROPOFOL 10 MG/ML IV BOLUS
INTRAVENOUS | Status: DC | PRN
  Administered 2016-05-07: 200 mg via INTRAVENOUS
  Administered 2016-05-07: 80 mg via INTRAVENOUS

## 2016-05-07 MED ORDER — ONDANSETRON HCL 4 MG/2ML IJ SOLN
INTRAMUSCULAR | Status: DC | PRN
  Administered 2016-05-07: 4 mg via INTRAVENOUS

## 2016-05-07 MED ORDER — FENTANYL CITRATE (PF) 100 MCG/2ML IJ SOLN
INTRAMUSCULAR | Status: AC
  Administered 2016-05-07: 50 ug
  Filled 2016-05-07: qty 2

## 2016-05-07 MED ORDER — OXYCODONE-ACETAMINOPHEN 5-325 MG PO TABS: 1.0000 | ORAL_TABLET | ORAL | 0 refills | Status: DC | PRN

## 2016-05-07 MED ORDER — MIDAZOLAM HCL 2 MG/2ML IJ SOLN
INTRAMUSCULAR | Status: DC | PRN
  Administered 2016-05-07: 1 mg via INTRAVENOUS

## 2016-05-07 MED ORDER — LIDOCAINE HCL (PF) 1 % IJ SOLN
INTRAMUSCULAR | Status: AC
  Filled 2016-05-07: qty 5

## 2016-05-07 MED ORDER — LIDOCAINE HCL (PF) 1 % IJ SOLN
INTRAMUSCULAR | Status: AC
  Filled 2016-05-07: qty 30

## 2016-05-07 MED ORDER — BUPIVACAINE HCL (PF) 0.5 % IJ SOLN
INTRAMUSCULAR | Status: AC
  Filled 2016-05-07: qty 30

## 2016-05-07 MED ORDER — CEFAZOLIN SODIUM-DEXTROSE 2-4 GM/100ML-% IV SOLN
2.0000 g | Freq: Once | INTRAVENOUS | Status: AC
  Administered 2016-05-07: 2 g via INTRAVENOUS

## 2016-05-07 MED ORDER — FAMOTIDINE 20 MG PO TABS
20.0000 mg | ORAL_TABLET | Freq: Once | ORAL | Status: AC
  Administered 2016-05-07: 20 mg via ORAL

## 2016-05-07 MED ORDER — SUGAMMADEX SODIUM 200 MG/2ML IV SOLN
INTRAVENOUS | Status: DC | PRN
  Administered 2016-05-07: 200 mg via INTRAVENOUS

## 2016-05-07 SURGICAL SUPPLY — 57 items
BANDAGE ELASTIC 4 LF NS (GAUZE/BANDAGES/DRESSINGS) ×4 IMPLANT
BANDAGE STRETCH 3X4.1 STRL (GAUZE/BANDAGES/DRESSINGS) IMPLANT
BLADE SURG 15 STRL LF DISP TIS (BLADE) IMPLANT
BLADE SURG 15 STRL SS (BLADE)
BLADE SURG MINI STRL (BLADE) IMPLANT
BNDG ESMARK 4X12 TAN STRL LF (GAUZE/BANDAGES/DRESSINGS) ×2 IMPLANT
BNDG ESMARK 6X12 TAN STRL LF (GAUZE/BANDAGES/DRESSINGS) ×2 IMPLANT
BNDG GAUZE 4.5X4.1 6PLY STRL (MISCELLANEOUS) ×2 IMPLANT
BNDG STRETCH 4X75 STRL LF (GAUZE/BANDAGES/DRESSINGS) ×2 IMPLANT
CANISTER SUCT 1200ML W/VALVE (MISCELLANEOUS) ×2 IMPLANT
CUFF TOURN 24 STER (MISCELLANEOUS) ×2 IMPLANT
DRAPE FLUOR MINI C-ARM 54X84 (DRAPES) IMPLANT
DURAPREP 26ML APPLICATOR (WOUND CARE) ×2 IMPLANT
ELECT REM PT RETURN 9FT ADLT (ELECTROSURGICAL) ×2
ELECTRODE REM PT RTRN 9FT ADLT (ELECTROSURGICAL) ×1 IMPLANT
GAUZE PETRO XEROFOAM 1X8 (MISCELLANEOUS) ×2 IMPLANT
GAUZE SPONGE 4X4 12PLY STRL (GAUZE/BANDAGES/DRESSINGS) ×2 IMPLANT
GAUZE STRETCH 2X75IN STRL (MISCELLANEOUS) IMPLANT
GLOVE BIO SURGEON STRL SZ7.5 (GLOVE) ×2 IMPLANT
GLOVE INDICATOR 8.0 STRL GRN (GLOVE) ×2 IMPLANT
GOWN STRL REUS W/ TWL LRG LVL3 (GOWN DISPOSABLE) ×2 IMPLANT
GOWN STRL REUS W/TWL LRG LVL3 (GOWN DISPOSABLE) ×2
HANDLE YANKAUER SUCT BULB TIP (MISCELLANEOUS) ×2 IMPLANT
KIT RM TURNOVER STRD PROC AR (KITS) ×2 IMPLANT
LABEL OR SOLS (LABEL) IMPLANT
NDL MAYO CATGUT SZ5 (NEEDLE)
NDL SUT 5 .5 CRC TPR PNT MAYO (NEEDLE) IMPLANT
NEEDLE FILTER BLUNT 18X 1/2SAF (NEEDLE) ×1
NEEDLE FILTER BLUNT 18X1 1/2 (NEEDLE) ×1 IMPLANT
NEEDLE HYPO 25X1 1.5 SAFETY (NEEDLE) ×4 IMPLANT
NS IRRIG 500ML POUR BTL (IV SOLUTION) ×2 IMPLANT
PACK EXTREMITY ARMC (MISCELLANEOUS) ×2 IMPLANT
PAD CAST CTTN 4X4 STRL (SOFTGOODS) ×1 IMPLANT
PADDING CAST COTTON 4X4 STRL (SOFTGOODS) ×1
RASP SM TEAR CROSS CUT (RASP) IMPLANT
SPLINT CAST 1 STEP 4X30 (MISCELLANEOUS) ×2 IMPLANT
SPLINT CAST 1 STEP 5X30 WHT (MISCELLANEOUS) ×2 IMPLANT
SPLINT FAST PLASTER 5X30 (CAST SUPPLIES)
SPLINT PLASTER CAST FAST 5X30 (CAST SUPPLIES) IMPLANT
SPONGE LAP 18X18 5 PK (GAUZE/BANDAGES/DRESSINGS) IMPLANT
STOCKINETTE M/LG 89821 (MISCELLANEOUS) ×2 IMPLANT
STRIP CLOSURE SKIN 1/2X4 (GAUZE/BANDAGES/DRESSINGS) ×2 IMPLANT
SUT MNCRL AB 4-0 PS2 18 (SUTURE) ×2 IMPLANT
SUT MNCRL+ 5-0 VIOLET P-3 (SUTURE) ×1 IMPLANT
SUT MONOCRYL 5-0 (SUTURE) ×1
SUT PDS AB 0 CT1 27 (SUTURE) ×2 IMPLANT
SUT VIC AB 0 SH 27 (SUTURE) IMPLANT
SUT VIC AB 2-0 SH 27 (SUTURE)
SUT VIC AB 2-0 SH 27XBRD (SUTURE) IMPLANT
SUT VIC AB 3-0 SH 27 (SUTURE) ×2
SUT VIC AB 3-0 SH 27X BRD (SUTURE) ×2 IMPLANT
SUT VIC AB 4-0 FS2 27 (SUTURE) ×2 IMPLANT
SUT VICRYL AB 3-0 FS1 BRD 27IN (SUTURE) IMPLANT
SWABSTK COMLB BENZOIN TINCTURE (MISCELLANEOUS) ×2 IMPLANT
SYR 3ML LL SCALE MARK (SYRINGE) IMPLANT
SYRINGE 10CC LL (SYRINGE) IMPLANT
WIRE MAGNUM (SUTURE) ×4 IMPLANT

## 2016-05-07 NOTE — Anesthesia Procedure Notes (Signed)
Procedure Name: Intubation Date/Time: 05/07/2016 12:18 PM Performed by: Darlyne Russian Pre-anesthesia Checklist: Patient identified, Emergency Drugs available, Suction available, Patient being monitored and Timeout performed Patient Re-evaluated:Patient Re-evaluated prior to inductionOxygen Delivery Method: Circle system utilized Preoxygenation: Pre-oxygenation with 100% oxygen Intubation Type: IV induction Ventilation: Mask ventilation without difficulty Laryngoscope Size: Mac and 4 Grade View: Grade II Tube type: Oral Tube size: 7.5 mm Number of attempts: 1 Airway Equipment and Method: Stylet Placement Confirmation: ETT inserted through vocal cords under direct vision,  positive ETCO2 and breath sounds checked- equal and bilateral Secured at: 23 cm Tube secured with: Tape Dental Injury: Teeth and Oropharynx as per pre-operative assessment

## 2016-05-07 NOTE — Anesthesia Procedure Notes (Signed)
Anesthesia Regional Block:  Popliteal block  Pre-Anesthetic Checklist: ,, timeout performed, Correct Patient, Correct Site, Correct Laterality, Correct Procedure, Correct Position, site marked, Risks and benefits discussed,  Surgical consent,  Pre-op evaluation,  At surgeon's request and post-op pain management  Laterality: Right and Lower  Prep: chloraprep       Needles:  Injection technique: Single-shot  Needle Type: Echogenic Needle     Needle Length: 9cm 9 cm Needle Gauge: 21 and 21 G    Additional Needles:  Procedures: ultrasound guided (picture in chart) Popliteal block Narrative:  Start time: 05/07/2016 11:45 AM End time: 05/07/2016 11:50 AM Injection made incrementally with aspirations every 5 mL.  Performed by: Personally  Anesthesiologist: Margorie JohnPISCITELLO, JOSEPH K  Additional Notes: Right sided adductor canal block as well  Functioning IV was confirmed and monitors were applied.  A echogenic needle was used. Sterile prep,hand hygiene and sterile gloves were used.  Negative aspiration and negative test dose prior to incremental administration of local anesthetic. The patient tolerated the procedure well with no immediate complications.

## 2016-05-07 NOTE — H&P (Signed)
HISTORY AND PHYSICAL INTERVAL NOTE:  05/07/2016  11:48 AM  Walter CopaAlbert L Morera  has presented today for surgery, with the diagnosis of ACHILLES TENDON RUPTURE RIGHT.  The various methods of treatment have been discussed with the patient.  No guarantees were given.  After consideration of risks, benefits and other options for treatment, the patient has consented to surgery.  I have reviewed the patients' chart and labs.    Patient Vitals for the past 24 hrs:  BP Temp Temp src Pulse Resp SpO2 Weight  05/07/16 1144 131/75 - - (!) 54 20 96 % -  05/07/16 1139 133/73 - - (!) 58 16 95 % -  05/07/16 1100 127/84 98.3 F (36.8 C) Tympanic 68 16 96 % 122.5 kg (270 lb)    A history and physical examination was performed in my office.  The patient was reexamined.  There have been no changes to this history and physical examination.  Gwyneth RevelsFowler, Arria Naim A

## 2016-05-07 NOTE — Anesthesia Post-op Follow-up Note (Cosign Needed)
Anesthesia QCDR form completed.        

## 2016-05-07 NOTE — Discharge Instructions (Signed)
AMBULATORY SURGERY  °DISCHARGE INSTRUCTIONS ° ° °1) The drugs that you were given will stay in your system until tomorrow so for the next 24 hours you should not: ° °A) Drive an automobile °B) Make any legal decisions °C) Drink any alcoholic beverage ° ° °2) You may resume regular meals tomorrow.  Today it is better to start with liquids and gradually work up to solid foods. ° °You may eat anything you prefer, but it is better to start with liquids, then soup and crackers, and gradually work up to solid foods. ° ° °3) Please notify your doctor immediately if you have any unusual bleeding, trouble breathing, redness and pain at the surgery site, drainage, fever, or pain not relieved by medication. °4)  ° °5) Your post-operative visit with Dr.                     °           °     is: Date:                        Time:   ° °Please call to schedule your post-operative visit. ° °6) Additional Instructions: ° ° ° ° ° °[facility] °[facility] ° °POST OPERATIVE INSTRUCTIONS FOR DR. TROXLER AND DR. FOWLER °[facility] ° ° °1. Take your medication as prescribed.  Pain medication should be taken only as needed. ° °2. Keep the dressing clean, dry and intact. ° °3. Keep your foot elevated above the heart level for the first 48 hours. ° °4. Walking to the bathroom and brief periods of walking are acceptable, unless we have instructed you to be non-weight bearing. ° °5. Always wear your post-op shoe when walking.  Always use your crutches if you are to be non-weight bearing. ° °6. Do not take a shower. Baths are permissible as long as the foot is kept out of the water.  ° °7. Every hour you are awake:  °- Bend your knee 15 times. °- Massage calf 15 times ° °8. Call [facility] ([phone]) if any of the following problems occur: °- You develop a temperature or fever. °- The bandage becomes saturated with blood. °- Medication does not stop your pain. °- Injury of the  foot occurs. °- Any symptoms of infection including redness, odor, or red streaks running from wound. ° °

## 2016-05-07 NOTE — Op Note (Signed)
Operative note   Surgeon:Candelaria Pies [employer] logistics/support/administrative officerowler    Assistant: None    Preop diagnosis: Right Achilles tendon rupture    Postop diagnosis: Same    Procedure: Open repair Achilles tendon rupture    EBL: Minimal    Anesthesia:regional and general    Hemostasis: Thigh tourniquet inflated to 250 mmHg for 61 minutes    Specimen: None    Complications: None    Operative indications:Walter Kim is an 56 y.o. that presents today for surgical intervention.  The risks/benefits/alternatives/complications have been discussed and consent has been given.    Procedure:  Patient was brought into the OR and placed on the operating table in theprone position. After anesthesia was obtained theright lower extremity was prepped and draped in usual sterile fashion.  Attention was directed to the posterior aspect of the Achilles tendon region as the watershed band where the palpable rupture was noted. A longitudinal incision of approximately 10 cm was performed. Sharp and blunt dissection carried down to the peritenon. The peritenon was then incised. Noted hemorrhagic drainage was found at the rupture site. There was a approximately a 3 cm gap between the tendon rupture ends. At this time all areas were debrided with scissors. Next a magnum wire was placed in a Krakw suture fashion. The foot was plantar flexed to bring the 2 ends of the ruptured tendon together and this was sutured with excellent stability noted on each side. Further 2-0 PDS was used in a Kessler stitch fashion around the site. Finally superficial closure over the past PDS and magnum wire was performed with a 2-0 Vicryl. All areas were flushed with copious amounts or irrigation. The peritenon was closed with a 3-0 Vicryl. The subcutaneous tissue closed with a 30 and the skin closed with a 4-0 Monocryl. 0.25% Marcaine was placed around all areas. A bulky sterile dressing was then applied. Patient was then placed in a posterior splint with foot in a  gravity plantarflexion consistent with neutral position.    Patient tolerated the procedure and anesthesia well.  Was transported from the OR to the PACU with all vital signs stable and vascular status intact. To be discharged per routine protocol.  Will follow up in approximately 1 week in the outpatient clinic.

## 2016-05-07 NOTE — Transfer of Care (Signed)
Immediate Anesthesia Transfer of Care Note  Patient: Walter Kim L Guild  Procedure(s) Performed: Procedure(s): ACHILLES TENDON REPAIR (Right)  Patient Location: PACU  Anesthesia Type:General  Level of Consciousness: patient cooperative and responds to stimulation  Airway & Oxygen Therapy: Patient Spontanous Breathing and Patient connected to nasal cannula oxygen  Post-op Assessment: Report given to RN and Post -op Vital signs reviewed and stable  Post vital signs: Reviewed and stable  Last Vitals:  Vitals:   05/07/16 1400 05/07/16 1405  BP:  124/81  Pulse: 67 62  Resp: 17 17  Temp:      Last Pain:  Vitals:   05/07/16 1355  TempSrc:   PainSc: 0-No pain         Complications: No apparent anesthesia complications

## 2016-05-07 NOTE — Anesthesia Preprocedure Evaluation (Signed)
Anesthesia Evaluation  Patient identified by MRN, date of birth, ID band Patient awake    Reviewed: Allergy & Precautions, H&P , NPO status , Patient's Chart, lab work & pertinent test results  History of Anesthesia Complications (+) PONV and history of anesthetic complications  Airway Mallampati: III  TM Distance: <3 FB Neck ROM: full    Dental no notable dental hx. (+) Poor Dentition, Chipped   Pulmonary neg shortness of breath, sleep apnea ,    Pulmonary exam normal breath sounds clear to auscultation       Cardiovascular Exercise Tolerance: Good (-) angina(-) Past MI and (-) DOE negative cardio ROS Normal cardiovascular exam Rhythm:regular Rate:Normal     Neuro/Psych negative neurological ROS  negative psych ROS   GI/Hepatic negative GI ROS, Neg liver ROS, neg GERD  ,  Endo/Other  negative endocrine ROS  Renal/GU      Musculoskeletal  (+) Arthritis ,   Abdominal   Peds  Hematology negative hematology ROS (+)   Anesthesia Other Findings Past Medical History: No date: Anemia     Comment: after 06-23-11 surgery No date: Arthritis No date: PONV (postoperative nausea and vomiting)     Comment: after back surgery and last hip surgery                06-23-2011 No date: Sleep apnea     Comment: stopbang=4  Past Surgical History: No date: BACK SURGERY     Comment: 20 yrs. ago No date: JOINT REPLACEMENT 09/22/2011: PARTIAL KNEE ARTHROPLASTY     Comment: Procedure: UNICOMPARTMENTAL KNEE;  Surgeon:               Shelda PalMatthew D Olin, MD;  Location: [facility];                Service: Orthopedics;  Laterality: Right;                Right Medial Unicompartmental Knee 4 yrs. ago: right knee arthroscopy No date: SEPTOPLASTY     Comment: [facility] No date: TONSILLECTOMY     Comment: as child 06/23/2011: TOTAL HIP ARTHROPLASTY     Comment: Procedure: TOTAL HIP ARTHROPLASTY ANTERIOR               APPROACH;  Surgeon:  Shelda PalMatthew D Olin, MD;                Location: [facility];  Service: Orthopedics;                Laterality: Left;  BMI    Body Mass Index:  36.62 kg/m      Reproductive/Obstetrics negative OB ROS                             Anesthesia Physical Anesthesia Plan  ASA: III  Anesthesia Plan: General ETT   Post-op Pain Management: GA combined w/ Regional for post-op pain   Induction:   Airway Management Planned:   Additional Equipment:   Intra-op Plan:   Post-operative Plan:   Informed Consent: I have reviewed the patients History and Physical, chart, labs and discussed the procedure including the risks, benefits and alternatives for the proposed anesthesia with the patient or authorized representative who has indicated his/her understanding and acceptance.   Dental Advisory Given  Plan Discussed with: Anesthesiologist, CRNA and Surgeon  Anesthesia Plan Comments:         Anesthesia Quick Evaluation

## 2016-05-08 ENCOUNTER — Encounter: Payer: Self-pay | Admitting: Podiatry

## 2016-05-08 NOTE — Anesthesia Postprocedure Evaluation (Signed)
Anesthesia Post Note  Patient: Walter Kim  Procedure(s) Performed: Procedure(s) (LRB): ACHILLES TENDON REPAIR (Right)  Patient location during evaluation: PACU Anesthesia Type: General Level of consciousness: awake and alert Pain management: pain level controlled Vital Signs Assessment: post-procedure vital signs reviewed and stable Respiratory status: spontaneous breathing, nonlabored ventilation, respiratory function stable and patient connected to nasal cannula oxygen Cardiovascular status: blood pressure returned to baseline and stable Postop Assessment: no signs of nausea or vomiting Anesthetic complications: no     Last Vitals:  Vitals:   05/07/16 1508 05/07/16 1553  BP: 115/68 120/68  Pulse: (!) 56 (!) 55  Resp: 16 16  Temp: 36.4 C     Last Pain:  Vitals:   05/07/16 1553  TempSrc:   PainSc: 2                  Cleda MccreedyJoseph K Anniemae Haberkorn

## 2016-05-14 DIAGNOSIS — S86012D Strain of left Achilles tendon, subsequent encounter: Secondary | ICD-10-CM | POA: Diagnosis not present

## 2016-06-05 DIAGNOSIS — S86011A Strain of right Achilles tendon, initial encounter: Secondary | ICD-10-CM | POA: Diagnosis not present

## 2016-06-08 DIAGNOSIS — M25571 Pain in right ankle and joints of right foot: Secondary | ICD-10-CM | POA: Diagnosis not present

## 2016-06-11 DIAGNOSIS — M25571 Pain in right ankle and joints of right foot: Secondary | ICD-10-CM | POA: Diagnosis not present

## 2016-06-15 DIAGNOSIS — M25571 Pain in right ankle and joints of right foot: Secondary | ICD-10-CM | POA: Diagnosis not present

## 2016-06-18 DIAGNOSIS — M25571 Pain in right ankle and joints of right foot: Secondary | ICD-10-CM | POA: Diagnosis not present

## 2016-07-02 DIAGNOSIS — M25571 Pain in right ankle and joints of right foot: Secondary | ICD-10-CM | POA: Diagnosis not present

## 2016-07-06 DIAGNOSIS — L400 Psoriasis vulgaris: Secondary | ICD-10-CM | POA: Diagnosis not present

## 2016-07-07 DIAGNOSIS — M25571 Pain in right ankle and joints of right foot: Secondary | ICD-10-CM | POA: Diagnosis not present

## 2016-07-08 DIAGNOSIS — J019 Acute sinusitis, unspecified: Secondary | ICD-10-CM | POA: Diagnosis not present

## 2016-07-08 DIAGNOSIS — R05 Cough: Secondary | ICD-10-CM | POA: Diagnosis not present

## 2016-07-09 DIAGNOSIS — M25571 Pain in right ankle and joints of right foot: Secondary | ICD-10-CM | POA: Diagnosis not present

## 2016-07-14 DIAGNOSIS — M25571 Pain in right ankle and joints of right foot: Secondary | ICD-10-CM | POA: Diagnosis not present

## 2016-07-21 DIAGNOSIS — M25571 Pain in right ankle and joints of right foot: Secondary | ICD-10-CM | POA: Diagnosis not present

## 2016-07-23 DIAGNOSIS — M25571 Pain in right ankle and joints of right foot: Secondary | ICD-10-CM | POA: Diagnosis not present

## 2016-07-28 DIAGNOSIS — M25571 Pain in right ankle and joints of right foot: Secondary | ICD-10-CM | POA: Diagnosis not present

## 2016-07-30 DIAGNOSIS — M25571 Pain in right ankle and joints of right foot: Secondary | ICD-10-CM | POA: Diagnosis not present

## 2016-08-04 DIAGNOSIS — M25571 Pain in right ankle and joints of right foot: Secondary | ICD-10-CM | POA: Diagnosis not present

## 2016-08-06 DIAGNOSIS — M25571 Pain in right ankle and joints of right foot: Secondary | ICD-10-CM | POA: Diagnosis not present

## 2016-08-10 DIAGNOSIS — M25571 Pain in right ankle and joints of right foot: Secondary | ICD-10-CM | POA: Diagnosis not present

## 2016-08-11 DIAGNOSIS — S86011D Strain of right Achilles tendon, subsequent encounter: Secondary | ICD-10-CM | POA: Diagnosis not present

## 2016-09-16 ENCOUNTER — Encounter: Payer: Self-pay | Admitting: Family Medicine

## 2016-09-16 ENCOUNTER — Ambulatory Visit (INDEPENDENT_AMBULATORY_CARE_PROVIDER_SITE_OTHER): Payer: BLUE CROSS/BLUE SHIELD | Admitting: Family Medicine

## 2016-09-16 VITALS — BP 110/74 | HR 72 | Temp 98.1°F | Resp 16 | Ht 72.0 in | Wt 272.0 lb

## 2016-09-16 DIAGNOSIS — R0681 Apnea, not elsewhere classified: Secondary | ICD-10-CM | POA: Diagnosis not present

## 2016-09-16 DIAGNOSIS — Z6836 Body mass index (BMI) 36.0-36.9, adult: Secondary | ICD-10-CM | POA: Diagnosis not present

## 2016-09-16 DIAGNOSIS — R0683 Snoring: Secondary | ICD-10-CM

## 2016-09-16 NOTE — Progress Notes (Signed)
Patient: Walter Kim Male    DOB: 05/08/1960   56 y.o.   MRN: [id number] Visit Date: 09/16/2016  Today's Provider: Megan Mansichard Blakeley Margraf Jr, MD   Chief Complaint  Patient presents with  . Snoring   Subjective:    HPI   Snoring Pt is presenting to office c/o possible sleep apnea. Pt snores. Pt's wife has informed pt that he stops breathing while asleep. Pt denies fatigue. Pt reports he gained about 25 pounds after having achilles tendon repair of right foot in January of this year.  No Known Allergies   Current Outpatient Prescriptions:  .  cholecalciferol (VITAMIN D) 1000 units tablet, Take by mouth., Disp: , Rfl:  .  ibuprofen (ADVIL) 200 MG tablet, Take 800 mg by mouth every 6 (six) hours as needed., Disp: , Rfl:   Review of Systems  Constitutional: Positive for unexpected weight change. Negative for activity change, appetite change, chills, diaphoresis, fatigue and fever.  Respiratory: Positive for apnea. Negative for shortness of breath.   Cardiovascular: Negative for chest pain, palpitations and leg swelling.  Gastrointestinal: Negative.   Allergic/Immunologic: Negative.   Psychiatric/Behavioral: Negative.     Social History  Substance Use Topics  . Smoking status: Never Smoker  . Smokeless tobacco: Never Used  . Alcohol use 1.2 oz/week    2 Cans of beer per week     Comment: monthly   Objective:   BP 110/74 (BP Location: Left Arm, Patient Position: Sitting, Cuff Size: Large)   Pulse 72   Temp 98.1 F (36.7 C) (Oral)   Resp 16   Ht 6' (1.829 m)   Wt 272 lb (123.4 kg)   BMI 36.89 kg/m  Vitals:   09/16/16 1625  BP: 110/74  Pulse: 72  Resp: 16  Temp: 98.1 F (36.7 C)  TempSrc: Oral  Weight: 272 lb (123.4 kg)  Height: 6' (1.829 m)     Physical Exam  Constitutional: He is oriented to person, place, and time. He appears well-developed and well-nourished.  HENT:  Head: Normocephalic and atraumatic.  Eyes: Conjunctivae are normal.  Neck: Normal  range of motion.  Cardiovascular: Normal rate, regular rhythm and normal heart sounds.   Pulmonary/Chest: Effort normal and breath sounds normal. No respiratory distress.  Neurological: He is alert and oriented to person, place, and time.  Skin: Skin is warm and dry.  Psychiatric: He has a normal mood and affect. His behavior is normal. Judgment and thought content normal.        Assessment & Plan:     1. Snoring Positive Epworth. Order sleep study.  - Split night study; Future  Epworth Sleepiness Scale:   Sitting and reading: Moderate chance of dozing  Watching TV: High chance of dozing  Sitting, inactive in a public place (e.g. a theatre or a meeting): Moderate chance of dozing  As a passenger in a car for an hour without a break: Slight chance of dozing  Lying down to rest in the afternoon when circumstances permit: Moderate chance of dozing  Sitting and talking to someone: Would never doze  Sitting quietly after a lunch without alcohol: Slight chance of dozing  In a car, while stopped for a few minutes in traffic: Would never doze   Total score: 11    2. Witnessed episode of apnea Order sleep study. - Split night study; Future  3. Class 2 severe obesity due to excess calories with serious comorbidity and body mass index (BMI) of  36.0 to 36.9 in adult [facility]) Pt not active due to recent achilles tendon repair. Advised pt to do non-weight bearing activities, such as swimming, recumbent bicycling, etc. Watch caloric intake.     I have done the exam and reviewed the above chart and it is accurate to the best of my knowledge. Dentist has been used in this note in any air is in the dictation or transcription are unintentional.  Megan Mans, MD  [facility]

## 2016-09-25 ENCOUNTER — Telehealth: Payer: Self-pay | Admitting: Family Medicine

## 2016-09-25 NOTE — Telephone Encounter (Signed)
Pt authorize [facility] to disclose records to [facility] pt's Past, Present and Future  09/16/16

## 2016-09-30 ENCOUNTER — Ambulatory Visit: Payer: Self-pay | Admitting: Family Medicine

## 2016-10-05 DIAGNOSIS — M25562 Pain in left knee: Secondary | ICD-10-CM | POA: Diagnosis not present

## 2016-10-08 DIAGNOSIS — M25562 Pain in left knee: Secondary | ICD-10-CM | POA: Diagnosis not present

## 2016-10-08 DIAGNOSIS — M1712 Unilateral primary osteoarthritis, left knee: Secondary | ICD-10-CM | POA: Diagnosis not present

## 2016-10-29 ENCOUNTER — Other Ambulatory Visit: Payer: Self-pay | Admitting: Emergency Medicine

## 2016-10-29 DIAGNOSIS — R0683 Snoring: Secondary | ICD-10-CM

## 2016-11-10 DIAGNOSIS — G4733 Obstructive sleep apnea (adult) (pediatric): Secondary | ICD-10-CM | POA: Diagnosis not present

## 2016-11-10 DIAGNOSIS — R0602 Shortness of breath: Secondary | ICD-10-CM | POA: Diagnosis not present

## 2016-11-11 DIAGNOSIS — R0602 Shortness of breath: Secondary | ICD-10-CM | POA: Diagnosis not present

## 2016-11-11 DIAGNOSIS — G4733 Obstructive sleep apnea (adult) (pediatric): Secondary | ICD-10-CM | POA: Diagnosis not present

## 2016-11-30 ENCOUNTER — Telehealth: Payer: Self-pay

## 2016-11-30 NOTE — Telephone Encounter (Signed)
Left message for someone from [facility] to call back about patient's sleep study. We do not have the results and need this to be re sent to us please. I need to fax that to [facility] (i have the information on my desk).=-aa

## 2016-12-01 NOTE — Telephone Encounter (Signed)
Called again today, and they are not open at this time.-aa

## 2016-12-01 NOTE — Telephone Encounter (Signed)
Also faxed [facility] office notes they requested and let them know on the fax that as soon as we get sleep study results we will fax that over to them also (fax is [phone])-aa

## 2016-12-01 NOTE — Telephone Encounter (Signed)
Called [facility] in home again, no answer, faxed request to get results to us.-aa

## 2016-12-03 ENCOUNTER — Telehealth: Payer: Self-pay | Admitting: Family Medicine

## 2016-12-03 NOTE — Telephone Encounter (Signed)
There is another message in the chart. I have contacted the [facility] in house lab that did the sleep study and fixed to them. I have not heard back after multiple attempts to get the study report to us, no one ever picks up when I call. I am not sure what to do?-aa

## 2016-12-03 NOTE — Telephone Encounter (Signed)
Mr. Walter Kim is calling for sleep study results.   He has called the sleep study place and has left multiple messages with no returned phone calls.   He is awaiting partial  Knee replacement  Surgery but cannot get this due to him having a sleep apnea and needs CPAP.   Patient needs to know what to do from this point.

## 2016-12-03 NOTE — Telephone Encounter (Signed)
I do not see a sleep study in the chart. Hasn't been done. I see it has been approved. It would  be an in-house sleep study

## 2016-12-04 NOTE — Telephone Encounter (Signed)
I am not sure what to do in this case, I have faxed and called several times trying to get the results and someone to call back with no response. Patient has called several times also with no call back. I think it would be better if you speak with patient about this personally. I do not know what to do. -aa

## 2016-12-04 NOTE — Telephone Encounter (Signed)
Called to try and get sleep study results again no answer left a message, and spoke to [employer] representative and let them know we are still trying to get this report -aa

## 2016-12-05 NOTE — Telephone Encounter (Signed)
I talked to pt. Try to call company again Monday--if not response we will have to set up home study with a different company come Tuesday. Pt is aware.

## 2016-12-08 NOTE — Telephone Encounter (Signed)
Received sleep study results. Faxed this information to [facility].-aa

## 2016-12-14 DIAGNOSIS — G4733 Obstructive sleep apnea (adult) (pediatric): Secondary | ICD-10-CM | POA: Insufficient documentation

## 2016-12-22 ENCOUNTER — Encounter: Payer: Self-pay | Admitting: Emergency Medicine

## 2016-12-23 ENCOUNTER — Ambulatory Visit (INDEPENDENT_AMBULATORY_CARE_PROVIDER_SITE_OTHER): Payer: BLUE CROSS/BLUE SHIELD | Admitting: Family Medicine

## 2016-12-23 VITALS — BP 112/68 | HR 62 | Resp 16 | Ht 72.0 in | Wt 271.0 lb

## 2016-12-23 DIAGNOSIS — R7303 Prediabetes: Secondary | ICD-10-CM | POA: Diagnosis not present

## 2016-12-23 DIAGNOSIS — Z125 Encounter for screening for malignant neoplasm of prostate: Secondary | ICD-10-CM

## 2016-12-23 DIAGNOSIS — Z0000 Encounter for general adult medical examination without abnormal findings: Secondary | ICD-10-CM

## 2016-12-23 DIAGNOSIS — Z23 Encounter for immunization: Secondary | ICD-10-CM | POA: Diagnosis not present

## 2016-12-23 DIAGNOSIS — G4733 Obstructive sleep apnea (adult) (pediatric): Secondary | ICD-10-CM | POA: Diagnosis not present

## 2016-12-23 LAB — POCT URINALYSIS DIPSTICK
BILIRUBIN UA: NEGATIVE
Ketones, UA: NEGATIVE
LEUKOCYTES UA: NEGATIVE
Nitrite, UA: NEGATIVE
PH UA: 6.5 (ref 5.0–8.0)
Protein, UA: NEGATIVE
Spec Grav, UA: 1.025 (ref 1.010–1.025)
Urobilinogen, UA: 0.2 E.U./dL

## 2016-12-23 NOTE — Progress Notes (Signed)
Patient: Walter Kim, Male    DOB: 09/17/1960, 56 y.o.   MRN: [id number] Visit Date: 12/23/2016  Today's Provider: Megan Mansichard Jakeira Seeman Jr, MD   Chief Complaint  Patient presents with  . Annual Exam   Subjective:  Walter Copalbert L Eick is a 56 y.o. male who presents today for health maintenance and complete physical. He feels fairly well. He reports exercising none. He reports he is sleeping well.  11/01/12 Colonoscopy, Dr. Concha NorwayWohl-Normal 12/14/07 Tdap 09/14/12 Hep C Antibody-neg   Review of Systems  Constitutional: Negative.   HENT: Negative.   Eyes: Negative.   Respiratory: Negative.   Cardiovascular: Negative.   Gastrointestinal: Negative.   Endocrine: Negative.   Genitourinary: Negative.   Musculoskeletal: Positive for arthralgias.  Skin: Negative.   Allergic/Immunologic: Negative.   Neurological: Negative.   Hematological: Negative.   Psychiatric/Behavioral: Negative.     Social History   Social History  . Marital status: Married    Spouse name: Lawson FiscalLori  . Number of children: 2  . Years of education: associates   Occupational History  . Self employed    Social History Main Topics  . Smoking status: Never Smoker  . Smokeless tobacco: Never Used  . Alcohol use 1.2 oz/week    2 Cans of beer per week     Comment: monthly  . Drug use: No  . Sexual activity: Not on file   Other Topics Concern  . Not on file   Social History Narrative  . No narrative on file    Patient Active Problem List   Diagnosis Date Noted  . OSA (obstructive sleep apnea) 12/14/2016  . S/P right UKR 09/22/2011  . S/P left THA, AA 06/23/2011    Past Surgical History:  Procedure Laterality Date  . ACHILLES TENDON SURGERY Right 05/07/2016   Procedure: ACHILLES TENDON REPAIR;  Surgeon: Gwyneth RevelsJustin Fowler, DPM;  Location: [facility] OR[facility];  Service: Podiatry;  Laterality: Right;  . BACK SURGERY     20 yrs. ago  . JOINT REPLACEMENT    . PARTIAL KNEE ARTHROPLASTY  09/22/2011   Procedure: UNICOMPARTMENTAL KNEE;   Surgeon: Shelda PalMatthew D Olin, MD;  Location: [facility];  Service: Orthopedics;  Laterality: Right;  Right Medial Unicompartmental Knee  . right knee arthroscopy  4 yrs. ago  . SEPTOPLASTY     [facility]  . TONSILLECTOMY     as child  . TOTAL HIP ARTHROPLASTY  06/23/2011   Procedure: TOTAL HIP ARTHROPLASTY ANTERIOR APPROACH;  Surgeon: Shelda PalMatthew D Olin, MD;  Location: [facility];  Service: Orthopedics;  Laterality: Left;    His family history includes Emphysema in his father and mother; Heart disease in his brother.     Outpatient Encounter Prescriptions as of 12/23/2016  Medication Sig Note  . cholecalciferol (VITAMIN D) 1000 units tablet Take by mouth. 05/05/2016: Duplicate medication on med list  . ibuprofen (ADVIL) 200 MG tablet Take 800 mg by mouth every 6 (six) hours as needed.    No facility-administered encounter medications on file as of 12/23/2016.     Fall Risk  12/23/2016  Falls in the past year? No   Depression screen PHQ 2/9 12/23/2016  Decreased Interest 0  Down, Depressed, Hopeless 0  PHQ - 2 Score 0  Altered sleeping 0  Tired, decreased energy 0  Change in appetite 0  Feeling bad or failure about yourself  0  Trouble concentrating 0  Moving slowly or fidgety/restless 0  Suicidal thoughts 0  PHQ-9 Score 0  Difficult doing work/chores  Not difficult at all   Functional Status Survey: Is the patient deaf or have difficulty hearing?: No Does the patient have difficulty seeing, even when wearing glasses/contacts?: No Does the patient have difficulty concentrating, remembering, or making decisions?: No Does the patient have difficulty walking or climbing stairs?: No Does the patient have difficulty dressing or bathing?: No Does the patient have difficulty doing errands alone such as visiting a doctor's office or shopping?: No   Patient Care Team: Maple Hudson., MD as PCP - General (Family Medicine)      Objective:   Vitals:  Vitals:   12/23/16 1424  BP: 112/68   Pulse: 62  Resp: 16  Weight: 271 lb (122.9 kg)  Height: 6' (1.829 m)    Physical Exam  Constitutional: He appears well-developed and well-nourished.  HENT:  Head: Normocephalic and atraumatic.  Right Ear: External ear normal.  Left Ear: External ear normal.  Nose: Nose normal.  Mouth/Throat: Oropharynx is clear and moist.  Eyes: Pupils are equal, round, and reactive to light. Conjunctivae and EOM are normal.  Neck: Normal range of motion. Neck supple.  Cardiovascular: Normal rate, regular rhythm, normal heart sounds and intact distal pulses.   Pulmonary/Chest: Effort normal and breath sounds normal.  Abdominal: Soft. Bowel sounds are normal.  Genitourinary: Rectum normal, prostate normal and penis normal.  Musculoskeletal: Normal range of motion.  Neurological: He is alert.  Skin: Skin is warm and dry.  Psychiatric: He has a normal mood and affect. His behavior is normal. Judgment and thought content normal.     Depression Screen PHQ 2/9 Scores 12/23/2016  PHQ - 2 Score 0  PHQ- 9 Score 0     Current Exercise Habits: The patient does not participate in regular exercise at present     Assessment & Plan:     Routine Health Maintenance and Physical Exam  Exercise Activities and Dietary recommendations Goals    None       There is no immunization history on file for this patient.  Health Maintenance  Topic Date Due  . Hepatitis C Screening  [birth year]  . HIV Screening  05/22/1975  . TETANUS/TDAP  05/22/1979  . INFLUENZA VACCINE  11/18/2016  . COLONOSCOPY  11/02/2022     Discussed health benefits of physical activity, and encouraged him to engage in regular exercise appropriate for his age and condition.  OSA Pt waiting to get CPAP. S/p Achilles repair 2017  I have done the exam and reviewed the chart and it is accurate to the best of my knowledge. Dentist has been used and  any errors in dictation or transcription are unintentional. Julieanne Manson M.D. [facility]

## 2016-12-24 ENCOUNTER — Encounter: Payer: Self-pay | Admitting: Family Medicine

## 2016-12-24 DIAGNOSIS — R7303 Prediabetes: Secondary | ICD-10-CM | POA: Diagnosis not present

## 2016-12-24 DIAGNOSIS — Z125 Encounter for screening for malignant neoplasm of prostate: Secondary | ICD-10-CM | POA: Diagnosis not present

## 2016-12-24 DIAGNOSIS — Z0000 Encounter for general adult medical examination without abnormal findings: Secondary | ICD-10-CM | POA: Diagnosis not present

## 2016-12-25 ENCOUNTER — Encounter: Payer: Self-pay | Admitting: Family Medicine

## 2016-12-25 LAB — CBC WITH DIFFERENTIAL/PLATELET
BASOS ABS: 0 10*3/uL (ref 0.0–0.2)
Basos: 1 %
EOS (ABSOLUTE): 0.1 10*3/uL (ref 0.0–0.4)
Eos: 1 %
Hematocrit: 48.8 % (ref 37.5–51.0)
Hemoglobin: 16.6 g/dL (ref 13.0–17.7)
Immature Grans (Abs): 0.1 10*3/uL (ref 0.0–0.1)
Immature Granulocytes: 1 %
LYMPHS ABS: 2.2 10*3/uL (ref 0.7–3.1)
Lymphs: 28 %
MCH: 32.4 pg (ref 26.6–33.0)
MCHC: 34 g/dL (ref 31.5–35.7)
MCV: 95 fL (ref 79–97)
MONOS ABS: 0.6 10*3/uL (ref 0.1–0.9)
Monocytes: 7 %
NEUTROS ABS: 4.8 10*3/uL (ref 1.4–7.0)
Neutrophils: 62 %
PLATELETS: 218 10*3/uL (ref 150–379)
RBC: 5.13 x10E6/uL (ref 4.14–5.80)
RDW: 14.4 % (ref 12.3–15.4)
WBC: 7.7 10*3/uL (ref 3.4–10.8)

## 2016-12-25 LAB — COMPREHENSIVE METABOLIC PANEL
A/G RATIO: 2.7 — AB (ref 1.2–2.2)
ALBUMIN: 4.9 g/dL (ref 3.5–5.5)
ALK PHOS: 74 IU/L (ref 39–117)
ALT: 32 IU/L (ref 0–44)
AST: 24 IU/L (ref 0–40)
BILIRUBIN TOTAL: 0.9 mg/dL (ref 0.0–1.2)
BUN / CREAT RATIO: 10 (ref 9–20)
BUN: 11 mg/dL (ref 6–24)
CHLORIDE: 102 mmol/L (ref 96–106)
CO2: 22 mmol/L (ref 20–29)
Calcium: 9.7 mg/dL (ref 8.7–10.2)
Creatinine, Ser: 1.06 mg/dL (ref 0.76–1.27)
GFR calc Af Amer: 90 mL/min/{1.73_m2} (ref >59)
GFR calc non Af Amer: 78 mL/min/{1.73_m2} (ref >59)
Globulin, Total: 1.8 g/dL (ref 1.5–4.5)
Glucose: 115 mg/dL — ABNORMAL HIGH (ref 65–99)
POTASSIUM: 5.1 mmol/L (ref 3.5–5.2)
Sodium: 140 mmol/L (ref 134–144)
Total Protein: 6.7 g/dL (ref 6.0–8.5)

## 2016-12-25 LAB — LIPID PANEL WITH LDL/HDL RATIO
Cholesterol, Total: 226 mg/dL — ABNORMAL HIGH (ref 100–199)
HDL: 41 mg/dL (ref >39)
LDL Calculated: 154 mg/dL — ABNORMAL HIGH (ref 0–99)
LDL/HDL RATIO: 3.8 ratio — AB (ref 0.0–3.6)
TRIGLYCERIDES: 156 mg/dL — AB (ref 0–149)
VLDL Cholesterol Cal: 31 mg/dL (ref 5–40)

## 2016-12-25 LAB — TSH: TSH: 2.35 u[IU]/mL (ref 0.450–4.500)

## 2016-12-25 LAB — HEMOGLOBIN A1C
Est. average glucose Bld gHb Est-mCnc: 131 mg/dL
HEMOGLOBIN A1C: 6.2 % — AB (ref 4.8–5.6)

## 2016-12-25 LAB — PSA: PROSTATE SPECIFIC AG, SERUM: 2.2 ng/mL (ref 0.0–4.0)

## 2016-12-28 ENCOUNTER — Telehealth: Payer: Self-pay

## 2016-12-28 NOTE — Telephone Encounter (Signed)
Pt advised.   Thanks,   -Dru Laurel  

## 2016-12-28 NOTE — Telephone Encounter (Signed)
-----   Message from Maple Hudsonichard L Gilbert Jr., MD sent at 12/28/2016  8:29 AM EDT ----- Labs stable. Recommend diet and exercise after patient recovers from surgery

## 2017-01-07 ENCOUNTER — Encounter: Payer: Self-pay | Admitting: Family Medicine

## 2017-01-22 DIAGNOSIS — G4733 Obstructive sleep apnea (adult) (pediatric): Secondary | ICD-10-CM | POA: Diagnosis not present

## 2017-02-22 DIAGNOSIS — G4733 Obstructive sleep apnea (adult) (pediatric): Secondary | ICD-10-CM | POA: Diagnosis not present

## 2017-02-25 DIAGNOSIS — M1712 Unilateral primary osteoarthritis, left knee: Secondary | ICD-10-CM | POA: Diagnosis not present

## 2017-03-05 ENCOUNTER — Ambulatory Visit: Payer: BLUE CROSS/BLUE SHIELD | Admitting: Family Medicine

## 2017-03-05 ENCOUNTER — Encounter: Payer: Self-pay | Admitting: Family Medicine

## 2017-03-05 VITALS — BP 112/64 | HR 72 | Temp 98.1°F | Resp 16 | Wt 270.0 lb

## 2017-03-05 DIAGNOSIS — R0602 Shortness of breath: Secondary | ICD-10-CM

## 2017-03-05 DIAGNOSIS — R059 Cough, unspecified: Secondary | ICD-10-CM

## 2017-03-05 DIAGNOSIS — R05 Cough: Secondary | ICD-10-CM

## 2017-03-05 MED ORDER — AZITHROMYCIN 250 MG PO TABS: ORAL_TABLET | ORAL | 0 refills | Status: AC

## 2017-03-05 NOTE — Progress Notes (Signed)
Patient: Walter Kim Male    DOB: 01/13/1961   56 y.o.   MRN: [id number] Visit Date: 03/05/2017  Today's Provider: Shirlee LatchAngela Zanna Hawn, MD   Chief Complaint  Patient presents with  . URI   Subjective:    URI   This is a new problem. The current episode started in the past 7 days (4 days). The problem has been unchanged. Associated symptoms include congestion, coughing, headaches, rhinorrhea, sinus pain, sneezing and a sore throat. Pertinent negatives include no wheezing. He has tried antihistamine, decongestant and acetaminophen for the symptoms. The treatment provided mild relief.   Has had bronchitis several times in the past.  Cough is productive of green sputum.  Mucinex and cold-ease - help some.  +SOB, no chest pain. Cough and shortness of breath are worse in the mornings.    No Known Allergies   Current Outpatient Medications:  .  cholecalciferol (VITAMIN D) 1000 units tablet, Take by mouth., Disp: , Rfl:  .  ibuprofen (ADVIL) 200 MG tablet, Take 800 mg by mouth every 6 (six) hours as needed., Disp: , Rfl:   Review of Systems  Constitutional: Negative.   HENT: Positive for congestion, rhinorrhea, sinus pain, sneezing and sore throat.   Respiratory: Positive for cough and shortness of breath. Negative for apnea and wheezing.   Cardiovascular: Negative.   Gastrointestinal: Negative.   Genitourinary: Negative.   Musculoskeletal: Negative.   Neurological: Positive for headaches.    Social History   Tobacco Use  . Smoking status: Never Smoker  . Smokeless tobacco: Never Used  Substance Use Topics  . Alcohol use: Yes    Alcohol/week: 1.2 oz    Types: 2 Cans of beer per week    Comment: monthly   Objective:   BP 112/64 (BP Location: Left Arm, Patient Position: Sitting, Cuff Size: Large)   Pulse 72   Temp 98.1 F (36.7 C)   Resp 16   Wt 270 lb (122.5 kg)   SpO2 95%   BMI 36.62 kg/m  Vitals:   03/05/17 1432  BP: 112/64  Pulse: 72  Resp: 16    Temp: 98.1 F (36.7 C)  SpO2: 95%  Weight: 270 lb (122.5 kg)     Physical Exam  Constitutional: He is oriented to person, place, and time. He appears well-developed and well-nourished. No distress.  HENT:  Head: Normocephalic and atraumatic.  Right Ear: External ear normal.  Left Ear: External ear normal.  Nose: Nose normal.  Mouth/Throat: Oropharynx is clear and moist. No oropharyngeal exudate.  Eyes: Conjunctivae are normal. Pupils are equal, round, and reactive to light. No scleral icterus.  Neck: Neck supple. No thyromegaly present.  Cardiovascular: Normal rate, regular rhythm, normal heart sounds and intact distal pulses.  No murmur heard. Pulmonary/Chest: Effort normal. No respiratory distress. He has no wheezes.  Crackles noted in left base  Musculoskeletal: He exhibits no edema.  Lymphadenopathy:    He has no cervical adenopathy.  Neurological: He is alert and oriented to person, place, and time.  Skin: Skin is warm and dry. No rash noted.  Psychiatric: He has a normal mood and affect. His behavior is normal.  Vitals reviewed.       Assessment & Plan:     1. Cough 2. Shortness of breath - concerning for possible community-acquired pneumonia given focal lung exam -Advised chest x-ray to fully evaluate - Plan to treat with azithromycin 5 day course - Return precautions discussed - DG Chest  2 View; Future   Return if symptoms worsen or fail to improve.     The entirety of the information documented in the History of Present Illness, Review of Systems and Physical Exam were personally obtained by me. Portions of this information were initially documented by Anson Oregonachelle Presley, CMA and reviewed by me for thoroughness and accuracy.     Shirlee LatchAngela Deckard Stuber, MD  [facility]

## 2017-03-05 NOTE — Patient Instructions (Signed)
Shortness of Breath, Adult  Shortness of breath means you have trouble breathing. Your lungs are organs for breathing.  Follow these instructions at home:  Pay attention to any changes in your symptoms. Take these actions to help with your condition:  ? Do not smoke. Smoking can cause shortness of breath. If you need help to quit smoking, ask your doctor.  ? Avoid things that can make it harder to breathe, such as:  ? Mold.  ? Dust.  ? Air pollution.  ? Chemical smells.  ? Things that can cause allergy symptoms (allergens), if you have allergies.  ? Keep your living space clean and free of mold and dust.  ? Rest as needed. Slowly return to your usual activities.  ? Take over-the-counter and prescription medicines, including oxygen and inhaled medicines, only as told by your doctor.  ? Keep all follow-up visits as told by your doctor. This is important.  Contact a doctor if:  ? Your condition does not get better as soon as expected.  ? You have a hard time doing your normal activities, even after you rest.  ? You have new symptoms.  Get help right away if:  ? You have trouble breathing when you are resting.  ? You feel light-headed or you faint.  ? You have a cough that is not helped by medicines.  ? You cough up blood.  ? You have pain with breathing.  ? You have pain in your chest, arms, shoulders, or belly (abdomen).  ? You have a fever.  ? You cannot walk up stairs.  ? You cannot exercise the way you normally do.  This information is not intended to replace advice given to you by your health care provider. Make sure you discuss any questions you have with your health care provider.  Document Released: 09/23/2007 Document Revised: 04/23/2016 Document Reviewed: 04/23/2016  Elsevier Interactive Patient Education ? 2017 Elsevier Inc[employer]

## 2017-03-18 DIAGNOSIS — M1712 Unilateral primary osteoarthritis, left knee: Secondary | ICD-10-CM | POA: Diagnosis not present

## 2017-03-22 DIAGNOSIS — M1712 Unilateral primary osteoarthritis, left knee: Secondary | ICD-10-CM | POA: Diagnosis not present

## 2017-03-24 DIAGNOSIS — G4733 Obstructive sleep apnea (adult) (pediatric): Secondary | ICD-10-CM | POA: Diagnosis not present

## 2017-03-24 DIAGNOSIS — M1712 Unilateral primary osteoarthritis, left knee: Secondary | ICD-10-CM | POA: Diagnosis not present

## 2017-03-31 DIAGNOSIS — M1712 Unilateral primary osteoarthritis, left knee: Secondary | ICD-10-CM | POA: Diagnosis not present

## 2017-04-02 DIAGNOSIS — M1712 Unilateral primary osteoarthritis, left knee: Secondary | ICD-10-CM | POA: Diagnosis not present

## 2017-04-05 DIAGNOSIS — M1712 Unilateral primary osteoarthritis, left knee: Secondary | ICD-10-CM | POA: Diagnosis not present

## 2017-04-08 DIAGNOSIS — M1712 Unilateral primary osteoarthritis, left knee: Secondary | ICD-10-CM | POA: Diagnosis not present

## 2017-04-09 DIAGNOSIS — M1712 Unilateral primary osteoarthritis, left knee: Secondary | ICD-10-CM | POA: Diagnosis not present

## 2017-04-12 DIAGNOSIS — M1712 Unilateral primary osteoarthritis, left knee: Secondary | ICD-10-CM | POA: Diagnosis not present

## 2017-04-15 DIAGNOSIS — M1712 Unilateral primary osteoarthritis, left knee: Secondary | ICD-10-CM | POA: Diagnosis not present

## 2017-04-16 DIAGNOSIS — M1712 Unilateral primary osteoarthritis, left knee: Secondary | ICD-10-CM | POA: Diagnosis not present

## 2017-04-19 DIAGNOSIS — M1712 Unilateral primary osteoarthritis, left knee: Secondary | ICD-10-CM | POA: Diagnosis not present

## 2017-04-21 DIAGNOSIS — M1712 Unilateral primary osteoarthritis, left knee: Secondary | ICD-10-CM | POA: Diagnosis not present

## 2017-04-23 DIAGNOSIS — M1712 Unilateral primary osteoarthritis, left knee: Secondary | ICD-10-CM | POA: Diagnosis not present

## 2017-04-24 DIAGNOSIS — G4733 Obstructive sleep apnea (adult) (pediatric): Secondary | ICD-10-CM | POA: Diagnosis not present

## 2017-04-26 DIAGNOSIS — M1712 Unilateral primary osteoarthritis, left knee: Secondary | ICD-10-CM | POA: Diagnosis not present

## 2017-04-28 DIAGNOSIS — M1712 Unilateral primary osteoarthritis, left knee: Secondary | ICD-10-CM | POA: Diagnosis not present

## 2017-04-30 DIAGNOSIS — M1712 Unilateral primary osteoarthritis, left knee: Secondary | ICD-10-CM | POA: Diagnosis not present

## 2017-05-03 DIAGNOSIS — M1712 Unilateral primary osteoarthritis, left knee: Secondary | ICD-10-CM | POA: Diagnosis not present

## 2017-05-25 DIAGNOSIS — G4733 Obstructive sleep apnea (adult) (pediatric): Secondary | ICD-10-CM | POA: Diagnosis not present

## 2017-12-02 DIAGNOSIS — M79601 Pain in right arm: Secondary | ICD-10-CM | POA: Diagnosis not present

## 2017-12-07 DIAGNOSIS — X58XXXA Exposure to other specified factors, initial encounter: Secondary | ICD-10-CM | POA: Diagnosis not present

## 2017-12-07 DIAGNOSIS — S46211A Strain of muscle, fascia and tendon of other parts of biceps, right arm, initial encounter: Secondary | ICD-10-CM | POA: Diagnosis not present

## 2017-12-07 DIAGNOSIS — Y929 Unspecified place or not applicable: Secondary | ICD-10-CM | POA: Diagnosis not present

## 2017-12-22 DIAGNOSIS — M25621 Stiffness of right elbow, not elsewhere classified: Secondary | ICD-10-CM | POA: Diagnosis not present

## 2017-12-22 DIAGNOSIS — S46211D Strain of muscle, fascia and tendon of other parts of biceps, right arm, subsequent encounter: Secondary | ICD-10-CM | POA: Diagnosis not present

## 2017-12-28 DIAGNOSIS — M25521 Pain in right elbow: Secondary | ICD-10-CM | POA: Diagnosis not present

## 2018-01-04 DIAGNOSIS — M79601 Pain in right arm: Secondary | ICD-10-CM | POA: Diagnosis not present

## 2018-01-11 DIAGNOSIS — M25621 Stiffness of right elbow, not elsewhere classified: Secondary | ICD-10-CM | POA: Diagnosis not present

## 2018-01-17 DIAGNOSIS — M25621 Stiffness of right elbow, not elsewhere classified: Secondary | ICD-10-CM | POA: Diagnosis not present

## 2018-01-19 DIAGNOSIS — S46211D Strain of muscle, fascia and tendon of other parts of biceps, right arm, subsequent encounter: Secondary | ICD-10-CM | POA: Diagnosis not present

## 2018-01-25 DIAGNOSIS — M25621 Stiffness of right elbow, not elsewhere classified: Secondary | ICD-10-CM | POA: Diagnosis not present

## 2018-02-01 DIAGNOSIS — M25621 Stiffness of right elbow, not elsewhere classified: Secondary | ICD-10-CM | POA: Diagnosis not present

## 2018-02-08 DIAGNOSIS — M25621 Stiffness of right elbow, not elsewhere classified: Secondary | ICD-10-CM | POA: Diagnosis not present

## 2018-02-15 DIAGNOSIS — M25621 Stiffness of right elbow, not elsewhere classified: Secondary | ICD-10-CM | POA: Diagnosis not present

## 2018-03-01 DIAGNOSIS — M25621 Stiffness of right elbow, not elsewhere classified: Secondary | ICD-10-CM | POA: Diagnosis not present

## 2018-03-07 DIAGNOSIS — S46211D Strain of muscle, fascia and tendon of other parts of biceps, right arm, subsequent encounter: Secondary | ICD-10-CM | POA: Diagnosis not present

## 2018-03-21 ENCOUNTER — Ambulatory Visit: Payer: BLUE CROSS/BLUE SHIELD | Admitting: Family Medicine

## 2018-03-21 VITALS — BP 110/68 | HR 64 | Temp 98.2°F | Resp 16 | Ht 72.0 in | Wt 226.0 lb

## 2018-03-21 DIAGNOSIS — R7303 Prediabetes: Secondary | ICD-10-CM

## 2018-03-21 DIAGNOSIS — M7989 Other specified soft tissue disorders: Secondary | ICD-10-CM | POA: Diagnosis not present

## 2018-03-21 DIAGNOSIS — Z125 Encounter for screening for malignant neoplasm of prostate: Secondary | ICD-10-CM | POA: Diagnosis not present

## 2018-03-21 DIAGNOSIS — Z1211 Encounter for screening for malignant neoplasm of colon: Secondary | ICD-10-CM

## 2018-03-21 DIAGNOSIS — Z0000 Encounter for general adult medical examination without abnormal findings: Secondary | ICD-10-CM

## 2018-03-21 NOTE — Progress Notes (Signed)
Patient: Walter Copalbert L Paquette, Male    DOB: 01/09/1961, 57 y.o.   MRN: [id number] Visit Date: 03/21/2018  Today's Provider: Megan Mansichard Gilbert Jr, MD   Chief Complaint  Patient presents with  . Annual Exam   Subjective:  Walter Kim is a 57 y.o. male who presents today for health maintenance and complete physical. He feels well. He reports exercising daily. He reports he is sleeping well.  11/01/12 Colonoscopy, Dr Concha NorwayWohl-Normal He is married for 33 years but is presently separated from his wife.  He has a son who is 3426 and is in IT, he has a daughter who is 1020 [facility] [employer] Patient weight has gone from 270 on 03/05/17 to 226 today He has intentionally lost his weight with diet and exercise. Review of Systems  Constitutional: Negative.   HENT: Negative.   Eyes: Negative.   Respiratory: Negative.   Cardiovascular: Negative.   Gastrointestinal: Negative.   Endocrine: Negative.   Genitourinary: Negative.   Musculoskeletal: Positive for arthralgias.  Skin: Negative.   Allergic/Immunologic: Negative.   Neurological: Negative.   Hematological: Negative.   Psychiatric/Behavioral: Negative.     Social History   Socioeconomic History  . Marital status: Legally Separated    Spouse name: Lawson FiscalLori  . Number of children: 2  . Years of education: associates  . Highest education level: Not on file  Occupational History  . Occupation: Self employed  Social Needs  . Financial resource strain: Not on file  . Food insecurity:    Worry: Not on file    Inability: Not on file  . Transportation needs:    Medical: Not on file    Non-medical: Not on file  Tobacco Use  . Smoking status: Never Smoker  . Smokeless tobacco: Never Used  Substance and Sexual Activity  . Alcohol use: Yes    Alcohol/week: 2.0 standard drinks    Types: 2 Cans of beer per week    Comment: monthly  . Drug use: No  . Sexual activity: Not on file  Lifestyle  . Physical activity:    Days per week: Not on file    Minutes per  session: Not on file  . Stress: Not on file  Relationships  . Social connections:    Talks on phone: Not on file    Gets together: Not on file    Attends religious service: Not on file    Active member of club or organization: Not on file    Attends meetings of clubs or organizations: Not on file    Relationship status: Not on file  . Intimate partner violence:    Fear of current or ex partner: Not on file    Emotionally abused: Not on file    Physically abused: Not on file    Forced sexual activity: Not on file  Other Topics Concern  . Not on file  Social History Narrative  . Not on file    Patient Active Problem List   Diagnosis Date Noted  . OSA (obstructive sleep apnea) 12/14/2016  . S/P right UKR 09/22/2011  . S/P left THA, AA 06/23/2011    Past Surgical History:  Procedure Laterality Date  . ACHILLES TENDON SURGERY Right 05/07/2016   Procedure: ACHILLES TENDON REPAIR;  Surgeon: Gwyneth RevelsJustin Fowler, DPM;  Location: [facility] OR[facility];  Service: Podiatry;  Laterality: Right;  . BACK SURGERY     20 yrs. ago  . JOINT REPLACEMENT    . PARTIAL KNEE ARTHROPLASTY  09/22/2011   Procedure: UNICOMPARTMENTAL  KNEE;  Surgeon: [name], MD;  Location: [facility];  Service: Orthopedics;  Laterality: Right;  Right Medial Unicompartmental Knee  . right knee arthroscopy  4 yrs. ago  . SEPTOPLASTY     [facility]  . TONSILLECTOMY     as child  . TOTAL HIP ARTHROPLASTY  06/23/2011   Procedure: TOTAL HIP ARTHROPLASTY ANTERIOR APPROACH;  Surgeon: [name], MD;  Location: [facility];  Service: Orthopedics;  Laterality: Left;    His family history includes Emphysema in his father and mother; Heart disease in his brother.     Outpatient Encounter Medications as of 03/21/2018  Medication Sig Note  . cholecalciferol (VITAMIN D) 1000 units tablet Take by mouth. 05/05/2016: Duplicate medication on med list  . [DISCONTINUED] ibuprofen (ADVIL) 200 MG tablet Take 800 mg by mouth every 6 (six) hours as  needed.    No facility-administered encounter medications on file as of 03/21/2018.     Patient Care Team: Maple Hudson., MD as PCP - General (Family Medicine)      Objective:   Vitals:  Vitals:   03/21/18 1015  BP: 110/68  Pulse: 64  Resp: 16  Temp: 98.2 F (36.8 C)  TempSrc: Oral  Weight: 226 lb (102.5 kg)  Height: 6' (1.829 m)    Physical Exam  Constitutional: He is oriented to person, place, and time. He appears well-developed and well-nourished.  HENT:  Head: Normocephalic and atraumatic.  Right Ear: External ear normal.  Left Ear: External ear normal.  Nose: Nose normal.  Mouth/Throat: Oropharynx is clear and moist.  Eyes: Pupils are equal, round, and reactive to light. Conjunctivae and EOM are normal.  Neck: Normal range of motion. Neck supple.  Cardiovascular: Normal rate, regular rhythm, normal heart sounds and intact distal pulses.  Pulmonary/Chest: Effort normal and breath sounds normal.  Abdominal: Soft. Bowel sounds are normal.  Musculoskeletal: Normal range of motion. He exhibits edema.  Left calf is 16 inches.  Right calf is 16-1/2 inches.  Negative cords, negative Homans.  Lateral calf is tender to palpation  Neurological: He is alert and oriented to person, place, and time.  Skin: Skin is warm and dry.  Psychiatric: He has a normal mood and affect. His behavior is normal. Judgment and thought content normal.   Fall Risk  03/21/2018 12/23/2016  Falls in the past year? 0 No   Depression Screen PHQ 2/9 Scores 03/21/2018 12/23/2016  PHQ - 2 Score 1 0  PHQ- 9 Score - 0   Functional Status Survey: Is the patient deaf or have difficulty hearing?: No Does the patient have difficulty seeing, even when wearing glasses/contacts?: No Does the patient have difficulty concentrating, remembering, or making decisions?: No Does the patient have difficulty walking or climbing stairs?: No Does the patient have difficulty dressing or bathing?: No Does the  patient have difficulty doing errands alone such as visiting a doctor's office or shopping?: No    Office Visit from 03/21/2018 in [facility]  AUDIT-C Score  2     Current Exercise Habits: Home exercise routine, Frequency (Times/Week): >7    Assessment & Plan:     Routine Health Maintenance and Physical Exam  Exercise Activities and Dietary recommendations Goals   None     Immunization History  Administered Date(s) Administered  . Influenza,inj,Quad PF,6+ Mos 12/23/2016  . Tdap 12/14/2007    Health Maintenance  Topic Date Due  . HIV Screening  05/22/1975  . INFLUENZA VACCINE  11/18/2017  . TETANUS/TDAP  12/13/2017  . COLONOSCOPY  11/02/2022  . Hepatitis C Screening  Completed     Discussed health benefits of physical activity, and encouraged him to engage in regular exercise appropriate for his age and condition.    1. Annual physical exam He has done a great job with his habits losing from 270 down to 226 pounds. - CBC with Differential/Platelet - Comprehensive metabolic panel - Lipid Panel With LDL/HDL Ratio - TSH  2. Prostate cancer screening  - PSA  3. Colon cancer screening   4. Pre-diabetes  - Hemoglobin A1c 5.Leg Swelling Obtain Dopplers to rule out DVT.  HPI, Exam and A&P Transcribed under the direction and in the presence of Julieanne Manson, Montez Hageman., MD. Electronically Signed: Janey Greaser, RMA I have done the exam and reviewed the chart and it is accurate to the best of my knowledge. Dentist has been used and  any errors in dictation or transcription are unintentional. Julieanne Manson M.D. [facility]

## 2018-03-23 DIAGNOSIS — Z125 Encounter for screening for malignant neoplasm of prostate: Secondary | ICD-10-CM | POA: Diagnosis not present

## 2018-03-23 DIAGNOSIS — R7303 Prediabetes: Secondary | ICD-10-CM | POA: Diagnosis not present

## 2018-03-23 DIAGNOSIS — Z0000 Encounter for general adult medical examination without abnormal findings: Secondary | ICD-10-CM | POA: Diagnosis not present

## 2018-03-24 ENCOUNTER — Ambulatory Visit: Payer: BLUE CROSS/BLUE SHIELD

## 2018-03-24 LAB — CBC WITH DIFFERENTIAL/PLATELET
BASOS ABS: 0.1 10*3/uL (ref 0.0–0.2)
Basos: 1 %
EOS (ABSOLUTE): 0.1 10*3/uL (ref 0.0–0.4)
EOS: 1 %
HEMATOCRIT: 45.7 % (ref 37.5–51.0)
HEMOGLOBIN: 15.9 g/dL (ref 13.0–17.7)
IMMATURE GRANS (ABS): 0 10*3/uL (ref 0.0–0.1)
Immature Granulocytes: 0 %
LYMPHS: 27 %
Lymphocytes Absolute: 2.1 10*3/uL (ref 0.7–3.1)
MCH: 33.1 pg — AB (ref 26.6–33.0)
MCHC: 34.8 g/dL (ref 31.5–35.7)
MCV: 95 fL (ref 79–97)
MONOCYTES: 7 %
Monocytes Absolute: 0.6 10*3/uL (ref 0.1–0.9)
Neutrophils Absolute: 4.9 10*3/uL (ref 1.4–7.0)
Neutrophils: 64 %
Platelets: 211 10*3/uL (ref 150–450)
RBC: 4.81 x10E6/uL (ref 4.14–5.80)
RDW: 12.9 % (ref 12.3–15.4)
WBC: 7.6 10*3/uL (ref 3.4–10.8)

## 2018-03-24 LAB — PSA: PROSTATE SPECIFIC AG, SERUM: 1.5 ng/mL (ref 0.0–4.0)

## 2018-03-24 LAB — COMPREHENSIVE METABOLIC PANEL
ALBUMIN: 4.7 g/dL (ref 3.5–5.5)
ALK PHOS: 81 IU/L (ref 39–117)
ALT: 16 IU/L (ref 0–44)
AST: 16 IU/L (ref 0–40)
Albumin/Globulin Ratio: 2.4 — ABNORMAL HIGH (ref 1.2–2.2)
BUN / CREAT RATIO: 12 (ref 9–20)
BUN: 12 mg/dL (ref 6–24)
Bilirubin Total: 1 mg/dL (ref 0.0–1.2)
CO2: 20 mmol/L (ref 20–29)
Calcium: 9.5 mg/dL (ref 8.7–10.2)
Chloride: 104 mmol/L (ref 96–106)
Creatinine, Ser: 1.04 mg/dL (ref 0.76–1.27)
GFR calc non Af Amer: 79 mL/min/{1.73_m2} (ref >59)
GFR, EST AFRICAN AMERICAN: 92 mL/min/{1.73_m2} (ref >59)
GLOBULIN, TOTAL: 2 g/dL (ref 1.5–4.5)
GLUCOSE: 97 mg/dL (ref 65–99)
Potassium: 4.6 mmol/L (ref 3.5–5.2)
SODIUM: 139 mmol/L (ref 134–144)
TOTAL PROTEIN: 6.7 g/dL (ref 6.0–8.5)

## 2018-03-24 LAB — LIPID PANEL WITH LDL/HDL RATIO
Cholesterol, Total: 152 mg/dL (ref 100–199)
HDL: 47 mg/dL (ref >39)
LDL Calculated: 97 mg/dL (ref 0–99)
LDL/HDL RATIO: 2.1 ratio (ref 0.0–3.6)
Triglycerides: 41 mg/dL (ref 0–149)
VLDL Cholesterol Cal: 8 mg/dL (ref 5–40)

## 2018-03-24 LAB — HEMOGLOBIN A1C
ESTIMATED AVERAGE GLUCOSE: 111 mg/dL
HEMOGLOBIN A1C: 5.5 % (ref 4.8–5.6)

## 2018-03-24 LAB — TSH: TSH: 1.42 u[IU]/mL (ref 0.450–4.500)

## 2018-03-28 ENCOUNTER — Telehealth: Payer: Self-pay

## 2018-03-28 NOTE — Telephone Encounter (Signed)
Patient advised as directed below.  Thanks,  -Joseline 

## 2018-03-28 NOTE — Telephone Encounter (Signed)
-----   Message from Maple Hudsonichard L Gilbert Jr., MD sent at 03/28/2018  8:18 AM EST ----- Labs OK

## 2018-05-05 IMAGING — MR MR ANKLE*R* W/O CM
4 of 6 series · 18 of 40 positions shown · non-contrast
Comparison: None.

CLINICAL DATA: Twisting ankle injury, Achilles tendon rupture.

EXAM:
MRI OF THE RIGHT ANKLE WITHOUT CONTRAST
TECHNIQUE: Multiplanar, multisequence MR imaging of the ankle was performed. No
intravenous contrast was administered.

[Series 3: PD fat-sat · axial · 4.0mm · 0.29mm/px · z∈[-85,+65]mm · 8 of 31 slices shown]
[im 1/31]
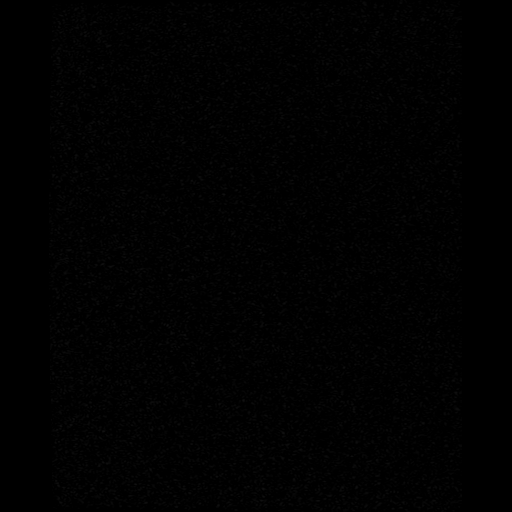
[im 5/31]
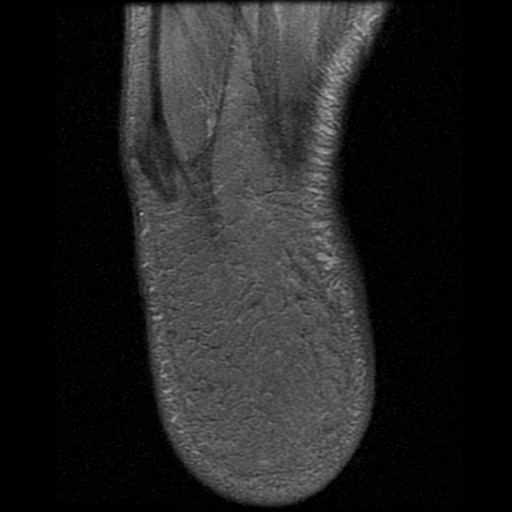
[im 9/31]
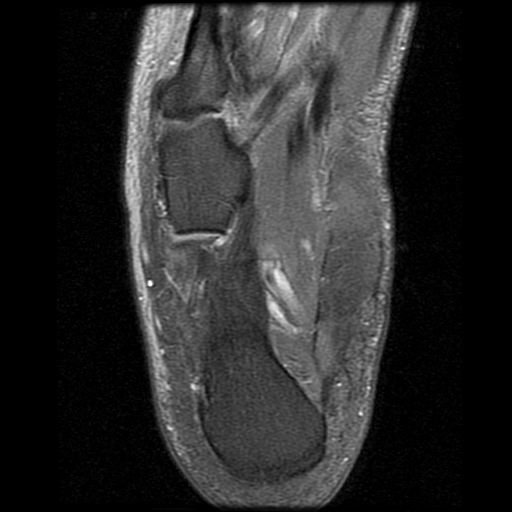
[im 13/31]
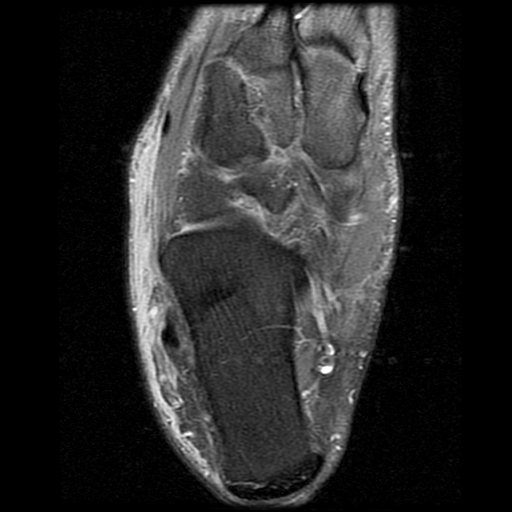
[im 18/31]
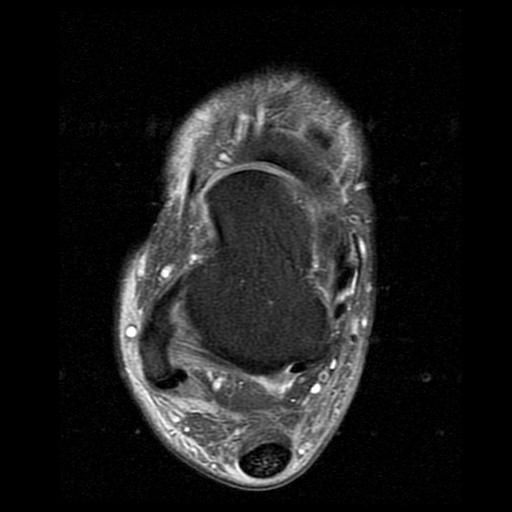
[im 22/31]
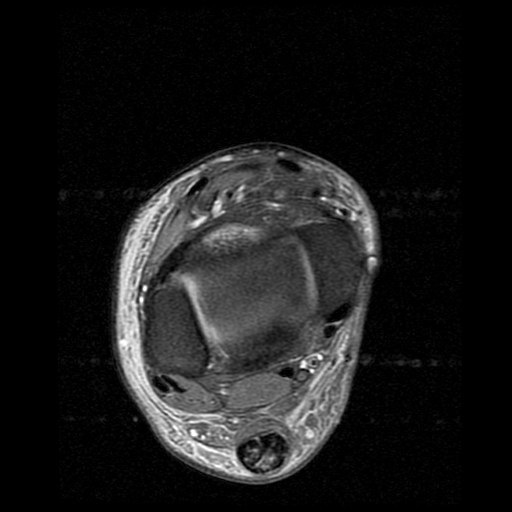
[im 26/31]
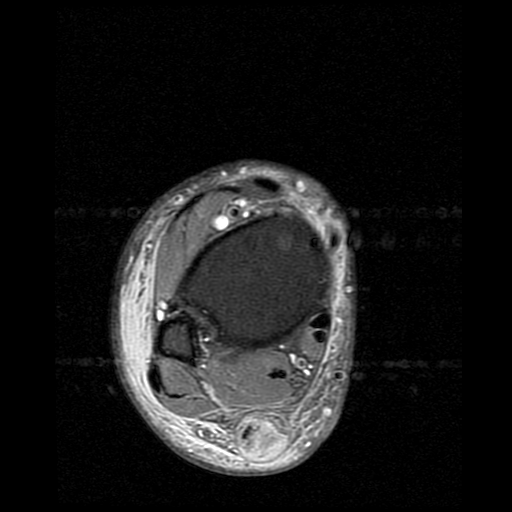
[im 31/31]
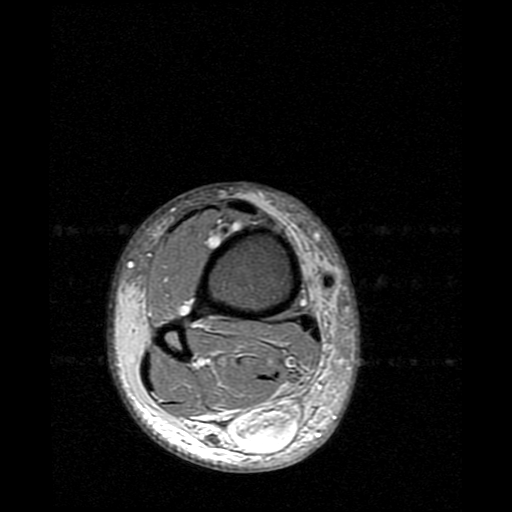

[Series 4: T2 fat-sat · axial · 4.0mm · 0.29mm/px · z∈[-85,+40]mm · 4 of 31 slices shown (1 of 2)]
[im 1/31]
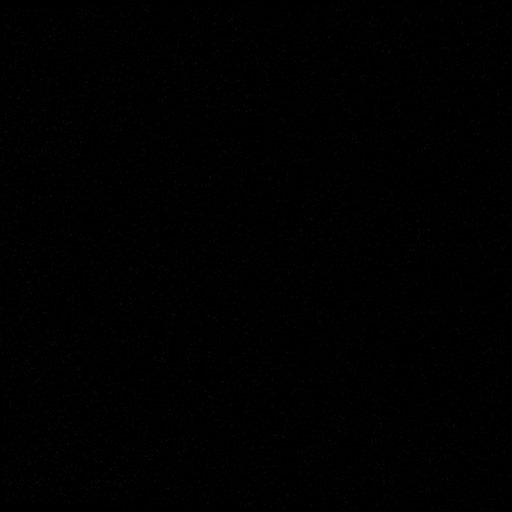
[im 5/31]
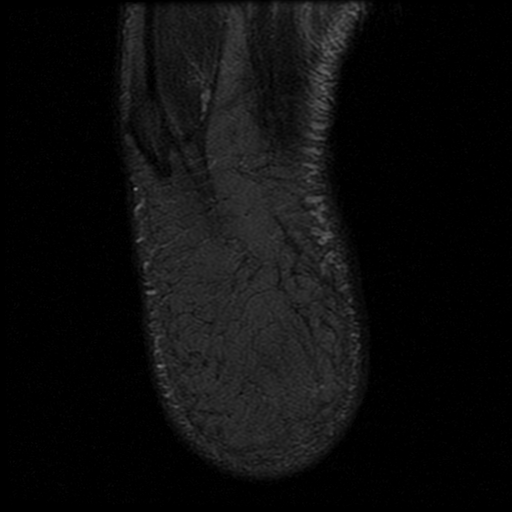
[im 18/31]
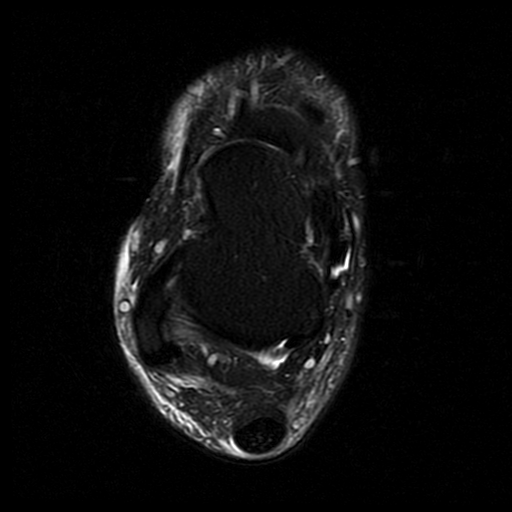
[im 26/31]
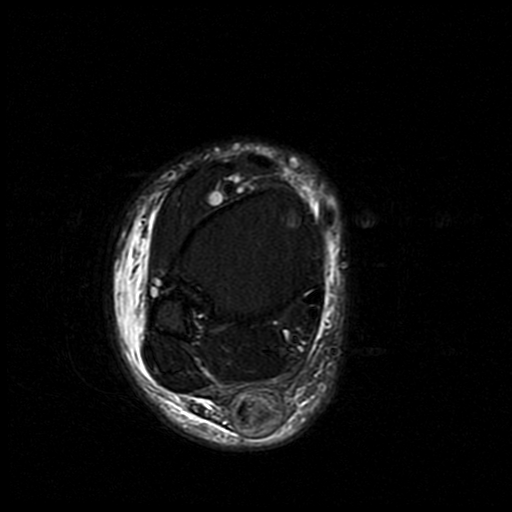

[Series 5: T1 · axial · 4.0mm · 0.29mm/px · z∈[-60,+40]mm · 3 of 31 slices shown]
[im 6/31]
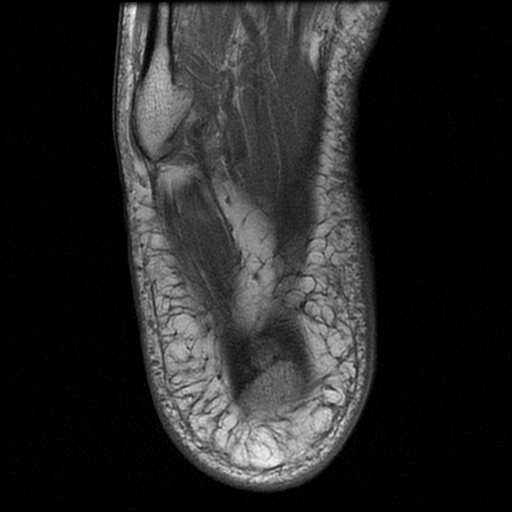
[im 16/31]
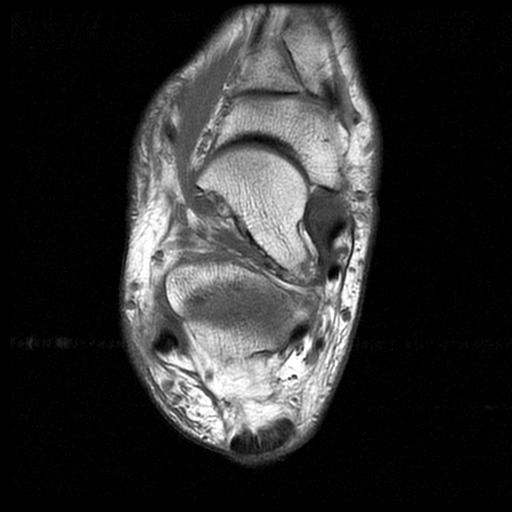
[im 26/31]
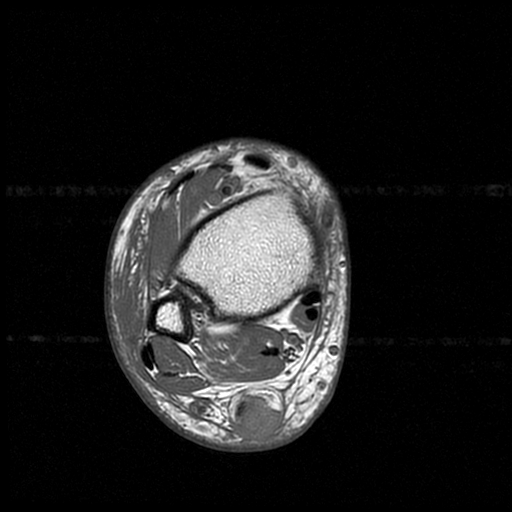

[Series 8: T2 fat-sat · coronal · 4.0mm · 0.33mm/px · 3 of 31 slices shown (2 of 2)]
[im 6/31]
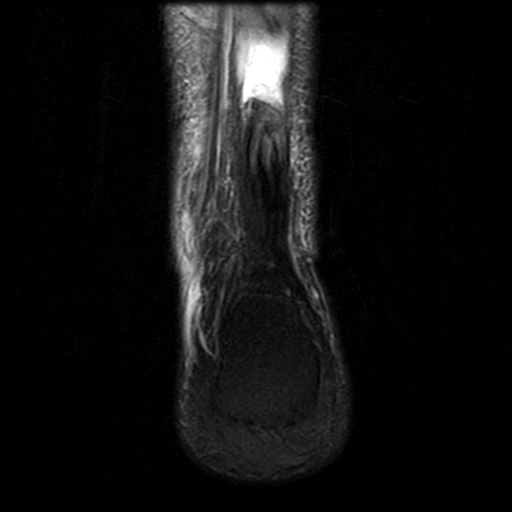
[im 16/31]
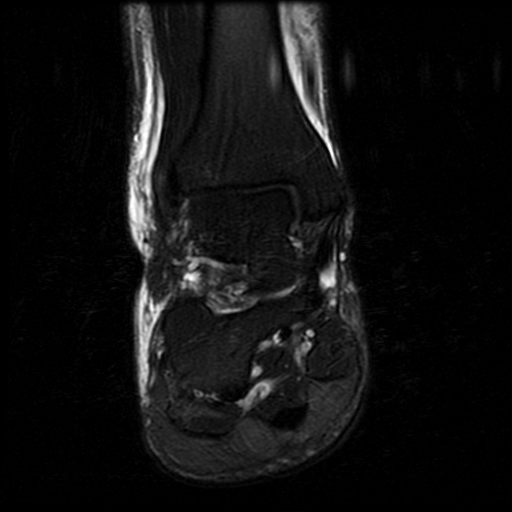
[im 26/31]
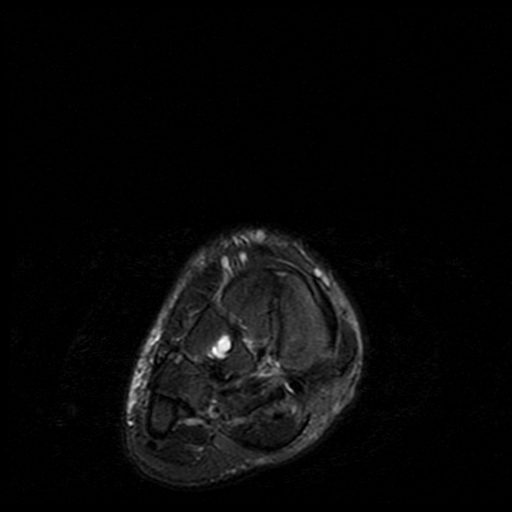

[18 of 40 positions shown; findings below may reference images not displayed]

FINDINGS: TENDONS

Peroneal: Unremarkable

Posteromedial: Distal tibialis posterior tenosynovitis and
tendinopathy. Mild flexor hallucis longus tenosynovitis just
proximal to the knot of Henry.

Anterior: Unremarkable

Achilles: Ruptured Achilles tendon with the discontinuity in the
tendon starting 7.4 cm proximal to the distal attachment site. There
is a primarily fluid and fatty filled gap extending over the 2.7 cm
region of discontinuity, with the distal margin of the proximal
segment barely visible on image [DATE]. Proximal margin of the distal
segment is thickened, edematous, and irregular also shown on image
[DATE].

Plantar Fascia: Thickened medial band without surrounding edema,
probably from plantar fasciitis, less likely plantar fibromatosis.

LIGAMENTS

Lateral: Unremarkable

Medial: Unremarkable

CARTILAGE

Ankle Joint: Subtle marrow edema along the anterior rim of the tibia
but no discrete osteochondral lesion is seen.

Subtalar Joints/Sinus Tarsi: Unremarkable

Bones: Erosions versus geodes distally in the lateral cuneiform and
along the third metatarsal side of the intermetatarsal joint between
the third and fourth metatarsals. The Lisfranc ligament is seen and
appears intact.

Other: Subcutaneous edema both medially and more specially laterally
along the ankle.
IMPRESSION: 1. Rupture of the distal Achilles tendon. The distal segment
measures 7.4 cm and has a thickened and edematous proximal margin.
This is separated from the distal margin of the proximal segment by
at least 2.7 cm, with the gap containing fluid and adipose tissue.
2. Distal tibialis posterior tenosynovitis and tendinopathy,
correlate clinically in assessing for tibialis posterior
dysfunction. There is also mild flexor hallucis longus
tenosynovitis.
3. Thickened medial band of the plantar fascia compatible with
plantar fasciitis or less likely plantar fibromatosis.
4. Small erosions or geodes along in the lateral cuneiform and third
metatarsal.
5. There is subtle marrow edema along the anterior tibial rim but no
discrete osteochondral lesion.

## 2018-12-06 ENCOUNTER — Ambulatory Visit (INDEPENDENT_AMBULATORY_CARE_PROVIDER_SITE_OTHER): Payer: BLUE CROSS/BLUE SHIELD | Admitting: Family Medicine

## 2018-12-06 ENCOUNTER — Other Ambulatory Visit: Payer: Self-pay

## 2018-12-06 ENCOUNTER — Encounter: Payer: Self-pay | Admitting: Family Medicine

## 2018-12-06 VITALS — Temp 97.5°F

## 2018-12-06 DIAGNOSIS — R109 Unspecified abdominal pain: Secondary | ICD-10-CM | POA: Diagnosis not present

## 2018-12-06 DIAGNOSIS — R197 Diarrhea, unspecified: Secondary | ICD-10-CM

## 2018-12-06 NOTE — Progress Notes (Signed)
       Patient: Walter Kim Male    DOB: 17-Jan-1961   58 y.o.   MRN: [id number] Visit Date: 12/06/2018  Today's Provider: Wilhemena Durie, MD   Chief Complaint  Patient presents with  . Abdominal Pain  . Diarrhea   Subjective:     HPI  Patient states that he has been experiencing severe abdominal pain and diarrhea since Sunday.   Virtual Visit via Telephone Note  I connected with Walter Kim on 12/06/18 at  3:20 PM EDT by telephone and verified that I am speaking with the correct person using two identifiers.   I discussed the limitations, risks, security and privacy concerns of performing an evaluation and management service by telephone and the availability of in person appointments. I also discussed with the patient that there may be a patient responsible charge related to this service. The patient expressed understanding and agreed to proceed.    I discussed the assessment and treatment plan with the patient. The patient was provided an opportunity to ask questions and all were answered. The patient agreed with the plan and demonstrated an understanding of the instructions.   The patient was advised to call back or seek an in-person evaluation if the symptoms worsen or if the condition fails to improve as anticipated.  Three days of diarrhea and epigastric pain with some body aches. No fevers or cough No known covid exposure.  No Known Allergies   Current Outpatient Medications:  .  cholecalciferol (VITAMIN D) 1000 units tablet, Take by mouth., Disp: , Rfl:   Review of Systems  Gastrointestinal: Positive for abdominal pain and diarrhea.  All other systems reviewed and are negative.   Social History   Tobacco Use  . Smoking status: Never Smoker  . Smokeless tobacco: Never Used  Substance Use Topics  . Alcohol use: Yes    Alcohol/week: 2.0 standard drinks    Types: 2 Cans of beer per week    Comment: monthly      Objective:   Temp (!) 97.5 F  (36.4 C) (Temporal)  Vitals:   12/06/18 0855  Temp: (!) 97.5 F (36.4 C)  TempSrc: Temporal     Physical Exam None  No results found for any visits on 12/06/18.     Assessment & Plan    1. Abdominal pain, unspecified abdominal location OTC prilosec daily for at least 2 weeks. - Novel Coronavirus, NAA ([facility])  2. Diarrhea, unspecified type Improving--try probiotic. Pt does have well water--consider flagyl if this persists. - Novel Coronavirus, NAA ([facility])     Wilhemena Durie, MD  [facility]

## 2018-12-07 ENCOUNTER — Other Ambulatory Visit: Payer: Self-pay

## 2018-12-07 DIAGNOSIS — Z20822 Contact with and (suspected) exposure to covid-19: Secondary | ICD-10-CM

## 2018-12-08 ENCOUNTER — Telehealth: Payer: Self-pay

## 2018-12-08 LAB — NOVEL CORONAVIRUS, NAA: SARS-CoV-2, NAA: NOT DETECTED

## 2018-12-08 NOTE — Telephone Encounter (Signed)
Patient advised as below.  

## 2018-12-08 NOTE — Telephone Encounter (Signed)
-----   Message from Jerrol Banana., MD sent at 12/08/2018  9:50 AM EDT ----- No covid.

## 2019-01-11 DIAGNOSIS — Z23 Encounter for immunization: Secondary | ICD-10-CM | POA: Diagnosis not present

## 2019-01-17 ENCOUNTER — Telehealth: Payer: Self-pay | Admitting: Family Medicine

## 2019-01-17 NOTE — Telephone Encounter (Signed)
Telephone visit scheduled. Patient declined doing a COVID test.

## 2019-01-17 NOTE — Telephone Encounter (Signed)
We can set up a phone visit but he also needs a COVID test.

## 2019-01-17 NOTE — Telephone Encounter (Signed)
Please review. Thanks!  

## 2019-01-17 NOTE — Telephone Encounter (Signed)
Patient called said he was running fevers of 100.3-101.0 on Sat and Sun and had sore throat then.  Both of those sx have stopped but now he is congested in head and chest. Coughing up yellow sputum.  Wants to know if he can get an antibiotic or a phone visit.  [facility] in [place].

## 2019-01-18 ENCOUNTER — Other Ambulatory Visit: Payer: Self-pay

## 2019-01-18 ENCOUNTER — Ambulatory Visit (INDEPENDENT_AMBULATORY_CARE_PROVIDER_SITE_OTHER): Payer: BLUE CROSS/BLUE SHIELD | Admitting: Family Medicine

## 2019-01-18 DIAGNOSIS — R05 Cough: Secondary | ICD-10-CM | POA: Diagnosis not present

## 2019-01-18 DIAGNOSIS — R059 Cough, unspecified: Secondary | ICD-10-CM

## 2019-01-18 DIAGNOSIS — J069 Acute upper respiratory infection, unspecified: Secondary | ICD-10-CM | POA: Diagnosis not present

## 2019-01-18 NOTE — Progress Notes (Signed)
Virtual Visit via Telephone Note  I connected with Walter Kim on 01/18/19 at  8:00 AM EDT by telephone and verified that I am speaking with the correct person using two identifiers.  Location: Patient: Home Office Provider: Office   I discussed the limitations, risks, security and privacy concerns of performing an evaluation and management service by telephone and the availability of in person appointments. I also discussed with the patient that there may be a patient responsible charge related to this service. The patient expressed understanding and agreed to proceed.   History of Present Illness: Patient complains of 5 days of head congestion that is now getting into his chest.  He has a mild cough sometimes productive.  No fever chills or no known COVID exposure.  He states his girlfriend was seen at a walk-in clinic yesterday and treated for sinusitis.  No GI symptoms.  No shortness of breath.   Observations/Objective:   Assessment and Plan: 1. Viral URI Discussed natural course of viral upper respiratory infections.  Try Robitussin morning and night.  Try zinc tablets daily until well.  Push fluids.  If he gets worse he will call us back and I will consider send in an antibiotic/Z-Pak.  2. Cough Patient declines COVID test.  I told him this is still possibly  COVID despite no known exposure.  He will let us know about this.   Follow Up Instructions:    I discussed the assessment and treatment plan with the patient. The patient was provided an opportunity to ask questions and all were answered. The patient agreed with the plan and demonstrated an understanding of the instructions.   The patient was advised to call back or seek an in-person evaluation if the symptoms worsen or if the condition fails to improve as anticipated.  I provided 10 minutes of non-face-to-face time during this encounter.   Wilhemena Durie, MD

## 2019-03-22 NOTE — Progress Notes (Signed)
Patient: Walter Kim, Male    DOB: 1961/04/08, 58 y.o.   MRN: [id number] Visit Date: 03/23/2019  Today's Provider: Wilhemena Durie, MD   Chief Complaint  Patient presents with  . Annual Exam   Subjective:     Annual physical exam Walter Kim is a 58 y.o. male who presents today for health maintenance and complete physical. He feels well. He reports exercising 5 days a week . 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 He is divorced and father of 90yo son and 42 yo daughter who is at [employer] [address]. -----------------------------------------------------------------  Colonoscopy: 12/25/2016  Review of Systems  Constitutional: Negative.   HENT: Negative.   Eyes: Negative.   Respiratory: Negative for cough.   Gastrointestinal: Negative for diarrhea, nausea and vomiting.  Endocrine: Negative.   Musculoskeletal: Negative.   Skin: Negative.   Allergic/Immunologic: Negative.   Neurological: Negative.   Psychiatric/Behavioral: Negative.     Social History      He  reports that he has never smoked. He has never used smokeless tobacco. He reports current alcohol use of about 2.0 standard drinks of alcohol per week. He reports that he does not use drugs.       Social History   Socioeconomic History  . Marital status: Legally Separated    Spouse name: Cecille Rubin  . Number of children: 2  . Years of education: associates  . Highest education level: Not on file  Occupational History  . Occupation: Self employed  Social Needs  . Financial resource strain: Not on file  . Food insecurity    Worry: Not on file    Inability: Not on file  . Transportation needs    Medical: Not on file    Non-medical: Not on file  Tobacco Use  . Smoking status: Never Smoker  . Smokeless tobacco: Never Used  Substance and Sexual Activity  . Alcohol use: Yes    Alcohol/week: 2.0 standard drinks    Types: 2 Cans of beer per week    Comment: monthly  . Drug use: No  .  Sexual activity: Not on file  Lifestyle  . Physical activity    Days per week: Not on file    Minutes per session: Not on file  . Stress: Not on file  Relationships  . Social Herbalist on phone: Not on file    Gets together: Not on file    Attends religious service: Not on file    Active member of club or organization: Not on file    Attends meetings of clubs or organizations: Not on file    Relationship status: Not on file  Other Topics Concern  . Not on file  Social History Narrative  . Not on file    Past Medical History:  Diagnosis Date  . Anemia    after 06-23-11 surgery  . Arthritis   . PONV (postoperative nausea and vomiting)    after back surgery and last hip surgery  06-23-2011  . Sleep apnea    stopbang=4     Patient Active Problem List   Diagnosis Date Noted  . Acute upper respiratory infection 03/23/2019  . Arthritis, degenerative 03/23/2019  . Hypercholesterolemia without hypertriglyceridemia 03/23/2019  . Screening examination for poliomyelitis 03/23/2019  . Received influenza vaccination at hospital 03/23/2019  . Pre-operative examination 03/23/2019  . Polypharmacy 03/23/2019  . Encounter for screening for malignant neoplasm of prostate 03/23/2019  .  OSA (obstructive sleep apnea) 12/14/2016  . S/P right UKR 09/22/2011  . S/P left THA, AA 06/23/2011  . Allergic dermatitis due to poison vine 11/26/2009  . Acute bronchitis 06/25/2008  . Avitaminosis D 06/25/2008    Past Surgical History:  Procedure Laterality Date  . ACHILLES TENDON SURGERY Right 05/07/2016   Procedure: ACHILLES TENDON REPAIR;  Surgeon: Samara Deist, DPM;  Location: [facility] ORS;  Service: Podiatry;  Laterality: Right;  . BACK SURGERY     20 yrs. ago  . JOINT REPLACEMENT    . PARTIAL KNEE ARTHROPLASTY  09/22/2011   Procedure: UNICOMPARTMENTAL KNEE;  Surgeon: Mauri Pole, MD;  Location: [facility];  Service: Orthopedics;  Laterality: Right;  Right Medial Unicompartmental Knee  .  right knee arthroscopy  4 yrs. ago  . [place]  . TONSILLECTOMY     as child  . TOTAL HIP ARTHROPLASTY  06/23/2011   Procedure: TOTAL HIP ARTHROPLASTY ANTERIOR APPROACH;  Surgeon: Mauri Pole, MD;  Location: [facility];  Service: Orthopedics;  Laterality: Left;    Family History        Family Status  Relation Name Status  . Mother  Deceased  . Father  Deceased  . Brother  Alive        His family history includes Emphysema in his father and mother; Heart disease in his brother.      No Known Allergies   Current Outpatient Medications:  .  cholecalciferol (VITAMIN D) 1000 units tablet, Take by mouth., Disp: , Rfl:    Patient Care Team: Jerrol Banana., MD as PCP - General (Family Medicine)    Objective:    Vitals: BP 100/64   Pulse (!) 54   Temp (!) 97.4 F (36.3 C) (Temporal)   Resp 18   Ht 6' (1.829 m)   Wt 230 lb 3.2 oz (104.4 kg)   BMI 31.22 kg/m    Vitals:   03/23/19 0928  BP: 100/64  Pulse: (!) 54  Resp: 18  Temp: (!) 97.4 F (36.3 C)  TempSrc: Temporal  Weight: 230 lb 3.2 oz (104.4 kg)  Height: 6' (1.829 m)     Physical Exam Vitals signs reviewed.  Constitutional:      Appearance: He is well-developed.  HENT:     Head: Normocephalic and atraumatic.     Right Ear: External ear normal.     Left Ear: External ear normal.     Nose: Nose normal.  Eyes:     Conjunctiva/sclera: Conjunctivae normal.     Pupils: Pupils are equal, round, and reactive to light.  Neck:     Musculoskeletal: Normal range of motion and neck supple.  Cardiovascular:     Rate and Rhythm: Normal rate and regular rhythm.     Heart sounds: Normal heart sounds.  Pulmonary:     Effort: Pulmonary effort is normal.     Breath sounds: Normal breath sounds.  Abdominal:     General: Bowel sounds are normal.     Palpations: Abdomen is soft.  Musculoskeletal: Normal range of motion.  Skin:    General: Skin is warm and dry.  Neurological:      General: No focal deficit present.     Mental Status: He is alert and oriented to person, place, and time.  Psychiatric:        Mood and Affect: Mood normal.        Behavior: Behavior normal.  Thought Content: Thought content normal.        Judgment: Judgment normal.      Depression Screen PHQ 2/9 Scores 03/23/2019 03/21/2018 12/23/2016  PHQ - 2 Score 1 1 0  PHQ- 9 Score 2 - 0       Assessment & Plan:     Routine Health Maintenance and Physical Exam  Exercise Activities and Dietary recommendations Goals   None     Immunization History  Administered Date(s) Administered  . Influenza Inj Mdck Quad With Preservative 02/09/2018  . Influenza Nasal 02/27/2008  . Influenza Split 02/14/2009, 02/12/2010, 01/20/2011  . Influenza,inj,Quad PF,6+ Mos 12/23/2016, 01/11/2019  . Influenza-Unspecified 02/01/2019  . Tdap 12/14/2007    Health Maintenance  Topic Date Due  . HIV Screening  05/22/1975  . TETANUS/TDAP  12/13/2017  . COLONOSCOPY  11/02/2022  . INFLUENZA VACCINE  Completed  . Hepatitis C Screening  Completed     Discussed health benefits of physical activity, and encouraged him to engage in regular exercise appropriate for his age and condition.    --------------------------------------------------------------------  1. Annual physical exam RTC  Year. - POCT Urinalysis Dipstick - CBC with Diff - Comp Met (CMET) - PSA - TSH - Lipid panel  2. Prostate cancer screening  - CBC with Diff - Comp Met (CMET) - PSA - TSH - Lipid panel  3. Hypercholesterolemia without hypertriglyceridemia   - CBC with Diff - Comp Met (CMET) - PSA - TSH - Lipid panel  4. OSA (obstructive sleep apnea)   5. Screening for thyroid disorder  - TSH  Richard Cranford Mon, MD  [facility]

## 2019-03-23 ENCOUNTER — Ambulatory Visit (INDEPENDENT_AMBULATORY_CARE_PROVIDER_SITE_OTHER): Payer: BC Managed Care – PPO | Admitting: Family Medicine

## 2019-03-23 ENCOUNTER — Other Ambulatory Visit: Payer: Self-pay

## 2019-03-23 ENCOUNTER — Encounter: Payer: Self-pay | Admitting: Family Medicine

## 2019-03-23 VITALS — BP 100/64 | HR 54 | Temp 97.4°F | Resp 18 | Ht 72.0 in | Wt 230.2 lb

## 2019-03-23 DIAGNOSIS — M1990 Unspecified osteoarthritis, unspecified site: Secondary | ICD-10-CM | POA: Insufficient documentation

## 2019-03-23 DIAGNOSIS — G4733 Obstructive sleep apnea (adult) (pediatric): Secondary | ICD-10-CM | POA: Diagnosis not present

## 2019-03-23 DIAGNOSIS — Z0000 Encounter for general adult medical examination without abnormal findings: Secondary | ICD-10-CM

## 2019-03-23 DIAGNOSIS — Z125 Encounter for screening for malignant neoplasm of prostate: Secondary | ICD-10-CM | POA: Diagnosis not present

## 2019-03-23 DIAGNOSIS — Z01818 Encounter for other preprocedural examination: Secondary | ICD-10-CM | POA: Insufficient documentation

## 2019-03-23 DIAGNOSIS — Z9229 Personal history of other drug therapy: Secondary | ICD-10-CM | POA: Insufficient documentation

## 2019-03-23 DIAGNOSIS — Z1329 Encounter for screening for other suspected endocrine disorder: Secondary | ICD-10-CM

## 2019-03-23 DIAGNOSIS — Z79899 Other long term (current) drug therapy: Secondary | ICD-10-CM | POA: Insufficient documentation

## 2019-03-23 DIAGNOSIS — E7800 Pure hypercholesterolemia, unspecified: Secondary | ICD-10-CM

## 2019-03-23 DIAGNOSIS — J069 Acute upper respiratory infection, unspecified: Secondary | ICD-10-CM | POA: Insufficient documentation

## 2019-03-23 DIAGNOSIS — Z1159 Encounter for screening for other viral diseases: Secondary | ICD-10-CM | POA: Insufficient documentation

## 2019-03-23 LAB — POCT URINALYSIS DIPSTICK
Bilirubin, UA: NEGATIVE
Glucose, UA: NEGATIVE
Ketones, UA: NEGATIVE
Leukocytes, UA: NEGATIVE
Nitrite, UA: NEGATIVE
Protein, UA: NEGATIVE
Spec Grav, UA: 1.02 (ref 1.010–1.025)
Urobilinogen, UA: 0.2 E.U./dL
pH, UA: 6 (ref 5.0–8.0)

## 2019-03-24 ENCOUNTER — Telehealth: Payer: Self-pay

## 2019-03-24 LAB — CBC WITH DIFFERENTIAL/PLATELET
Basophils Absolute: 0.1 10*3/uL (ref 0.0–0.2)
Basos: 1 %
EOS (ABSOLUTE): 0.1 10*3/uL (ref 0.0–0.4)
Eos: 1 %
Hematocrit: 44.3 % (ref 37.5–51.0)
Hemoglobin: 15.7 g/dL (ref 13.0–17.7)
Immature Grans (Abs): 0 10*3/uL (ref 0.0–0.1)
Immature Granulocytes: 0 %
Lymphocytes Absolute: 2.4 10*3/uL (ref 0.7–3.1)
Lymphs: 36 %
MCH: 33.4 pg — ABNORMAL HIGH (ref 26.6–33.0)
MCHC: 35.4 g/dL (ref 31.5–35.7)
MCV: 94 fL (ref 79–97)
Monocytes Absolute: 0.4 10*3/uL (ref 0.1–0.9)
Monocytes: 7 %
Neutrophils Absolute: 3.7 10*3/uL (ref 1.4–7.0)
Neutrophils: 55 %
Platelets: 210 10*3/uL (ref 150–450)
RBC: 4.7 x10E6/uL (ref 4.14–5.80)
RDW: 13.1 % (ref 11.6–15.4)
WBC: 6.7 10*3/uL (ref 3.4–10.8)

## 2019-03-24 LAB — COMPREHENSIVE METABOLIC PANEL
ALT: 15 IU/L (ref 0–44)
AST: 19 IU/L (ref 0–40)
Albumin/Globulin Ratio: 2.6 — ABNORMAL HIGH (ref 1.2–2.2)
Albumin: 4.6 g/dL (ref 3.8–4.9)
Alkaline Phosphatase: 82 IU/L (ref 39–117)
BUN/Creatinine Ratio: 14 (ref 9–20)
BUN: 14 mg/dL (ref 6–24)
Bilirubin Total: 0.9 mg/dL (ref 0.0–1.2)
CO2: 22 mmol/L (ref 20–29)
Calcium: 9.8 mg/dL (ref 8.7–10.2)
Chloride: 103 mmol/L (ref 96–106)
Creatinine, Ser: 0.97 mg/dL (ref 0.76–1.27)
GFR calc Af Amer: 99 mL/min/{1.73_m2} (ref >59)
GFR calc non Af Amer: 86 mL/min/{1.73_m2} (ref >59)
Globulin, Total: 1.8 g/dL (ref 1.5–4.5)
Glucose: 101 mg/dL — ABNORMAL HIGH (ref 65–99)
Potassium: 4.7 mmol/L (ref 3.5–5.2)
Sodium: 138 mmol/L (ref 134–144)
Total Protein: 6.4 g/dL (ref 6.0–8.5)

## 2019-03-24 LAB — LIPID PANEL
Chol/HDL Ratio: 3.5 ratio (ref 0.0–5.0)
Cholesterol, Total: 165 mg/dL (ref 100–199)
HDL: 47 mg/dL (ref >39)
LDL Chol Calc (NIH): 109 mg/dL — ABNORMAL HIGH (ref 0–99)
Triglycerides: 41 mg/dL (ref 0–149)
VLDL Cholesterol Cal: 9 mg/dL (ref 5–40)

## 2019-03-24 LAB — TSH: TSH: 1.83 u[IU]/mL (ref 0.450–4.500)

## 2019-03-24 LAB — PSA: Prostate Specific Ag, Serum: 1.4 ng/mL (ref 0.0–4.0)

## 2019-03-24 NOTE — Telephone Encounter (Signed)
LVM about normal labs and for patient to contact with any questions.

## 2019-03-24 NOTE — Telephone Encounter (Signed)
-----   Message from Jerrol Banana., MD sent at 03/24/2019  7:20 AM EST ----- Labs good.

## 2019-12-06 DIAGNOSIS — Z20822 Contact with and (suspected) exposure to covid-19: Secondary | ICD-10-CM | POA: Diagnosis not present

## 2020-03-25 NOTE — Progress Notes (Signed)
I,April Miller,acting as a scribe for Megan Mans, MD.,have documented all relevant documentation on the behalf of Megan Mans, MD,as directed by  Megan Mans, MD while in the presence of Megan Mans, MD.   Complete physical exam   Patient: Walter Kim   DOB: [birth year]   59 y.o. Male  MRN: [id number] Visit Date: 03/26/2020  Today's healthcare provider: Megan Mans, MD   Chief Complaint  Patient presents with  . Annual Exam   Subjective    Walter Kim is a 59 y.o. male who presents today for a complete physical exam.  He reports consuming a general diet. Home exercise routine includes gym 3-5 times per week. He generally feels well. He reports sleeping well. He does not have additional problems to discuss today.  HPI    Past Medical History:  Diagnosis Date  . Anemia    after 06-23-11 surgery  . Arthritis   . PONV (postoperative nausea and vomiting)    after back surgery and last hip surgery  06-23-2011  . Sleep apnea    stopbang=4   Past Surgical History:  Procedure Laterality Date  . ACHILLES TENDON SURGERY Right 05/07/2016   Procedure: ACHILLES TENDON REPAIR;  Surgeon: Gwyneth Revels, DPM;  Location: [facility] ORS;  Service: Podiatry;  Laterality: Right;  . BACK SURGERY     20 yrs. ago  . JOINT REPLACEMENT    . PARTIAL KNEE ARTHROPLASTY  09/22/2011   Procedure: UNICOMPARTMENTAL KNEE;  Surgeon: [name], MD;  Location: [facility];  Service: Orthopedics;  Laterality: Right;  Right Medial Unicompartmental Knee  . right knee arthroscopy  4 yrs. ago  . SEPTOPLASTY     [facility]  . TONSILLECTOMY     as child  . TOTAL HIP ARTHROPLASTY  06/23/2011   Procedure: TOTAL HIP ARTHROPLASTY ANTERIOR APPROACH;  Surgeon: [name], MD;  Location: [facility];  Service: Orthopedics;  Laterality: Left;   Social History   Socioeconomic History  . Marital status: Legally Separated    Spouse name: Lawson Fiscal  . Number of children: 2  . Years  of education: associates  . Highest education level: Not on file  Occupational History  . Occupation: Self employed  Tobacco Use  . Smoking status: Never Smoker  . Smokeless tobacco: Never Used  Vaping Use  . Vaping Use: Never used  Substance and Sexual Activity  . Alcohol use: Yes    Alcohol/week: 2.0 standard drinks    Types: 2 Cans of beer per week    Comment: monthly  . Drug use: No  . Sexual activity: Not on file  Other Topics Concern  . Not on file  Social History Narrative  . Not on file   Social Determinants of Health   Financial Resource Strain:   . Difficulty of Paying Living Expenses: Not on file  Food Insecurity:   . Worried About Programme researcher, broadcasting/film/video in the Last Year: Not on file  . Ran Out of Food in the Last Year: Not on file  Transportation Needs:   . Lack of Transportation (Medical): Not on file  . Lack of Transportation (Non-Medical): Not on file  Physical Activity:   . Days of Exercise per Week: Not on file  . Minutes of Exercise per Session: Not on file  Stress:   . Feeling of Stress : Not on file  Social Connections:   . Frequency of Communication with Friends and Family: Not on  file  . Frequency of Social Gatherings with Friends and Family: Not on file  . Attends Religious Services: Not on file  . Active Member of Clubs or Organizations: Not on file  . Attends Banker Meetings: Not on file  . Marital Status: Not on file  Intimate Partner Violence:   . Fear of Current or Ex-Partner: Not on file  . Emotionally Abused: Not on file  . Physically Abused: Not on file  . Sexually Abused: Not on file   Family Status  Relation Name Status  . Mother  Deceased  . Father  Deceased  . Brother  Alive   Family History  Problem Relation Age of Onset  . Emphysema Mother   . Emphysema Father   . Heart disease Brother    No Known Allergies  Patient Care Team: Maple Hudson., MD as PCP - General (Family Medicine)    Medications: Outpatient Medications Prior to Visit  Medication Sig  . cholecalciferol (VITAMIN D) 1000 units tablet Take by mouth.   No facility-administered medications prior to visit.    Review of Systems  Respiratory: Positive for cough.   All other systems reviewed and are negative.      Objective    BP 108/70 (BP Location: Left Arm, Patient Position: Sitting, Cuff Size: Large)   Pulse (!) 57   Temp 98.7 F (37.1 C) (Oral)   Resp 16   Ht 6' (1.829 m)   Wt 260 lb (117.9 kg)   SpO2 96%   BMI 35.26 kg/m  BP Readings from Last 3 Encounters:  03/26/20 108/70  03/23/19 100/64  03/21/18 110/68   Wt Readings from Last 3 Encounters:  03/26/20 260 lb (117.9 kg)  03/23/19 230 lb 3.2 oz (104.4 kg)  03/21/18 226 lb (102.5 kg)      Physical Exam Vitals reviewed.  Constitutional:      Appearance: He is well-developed.  HENT:     Head: Normocephalic and atraumatic.     Right Ear: External ear normal.     Left Ear: External ear normal.     Nose: Nose normal.  Eyes:     Conjunctiva/sclera: Conjunctivae normal.     Pupils: Pupils are equal, round, and reactive to light.  Cardiovascular:     Rate and Rhythm: Normal rate and regular rhythm.     Heart sounds: Normal heart sounds.  Pulmonary:     Effort: Pulmonary effort is normal.     Breath sounds: Normal breath sounds.  Abdominal:     General: Bowel sounds are normal.     Palpations: Abdomen is soft.  Genitourinary:    Penis: Normal.      Testes: Normal.     Prostate: Normal.     Rectum: Normal. Guaiac result negative.  Musculoskeletal:        General: Normal range of motion.     Cervical back: Normal range of motion and neck supple.  Skin:    General: Skin is warm and dry.  Neurological:     General: No focal deficit present.     Mental Status: He is alert and oriented to person, place, and time.  Psychiatric:        Mood and Affect: Mood normal.        Behavior: Behavior normal.        Thought Content:  Thought content normal.        Judgment: Judgment normal.       Last depression screening scores  PHQ 2/9 Scores 03/26/2020 03/23/2019 03/21/2018  PHQ - 2 Score 0 1 1  PHQ- 9 Score 2 2 -   Last fall risk screening Fall Risk  03/23/2019  Falls in the past year? 0  Number falls in past yr: 0  Injury with Fall? 0  Follow up Falls evaluation completed   Last Audit-C alcohol use screening Alcohol Use Disorder Test (AUDIT) 03/26/2020  1. How often do you have a drink containing alcohol? 2  2. How many drinks containing alcohol do you have on a typical day when you are drinking? 0  3. How often do you have six or more drinks on one occasion? 0  AUDIT-C Score 2  Alcohol Brief Interventions/Follow-up AUDIT Score <7 follow-up not indicated   A score of 3 or more in women, and 4 or more in men indicates increased risk for alcohol abuse, EXCEPT if all of the points are from question 1   Results for orders placed or performed in visit on 03/26/20  POCT urinalysis dipstick  Result Value Ref Range   Color, UA Yellow    Clarity, UA Clear    Glucose, UA Negative Negative   Bilirubin, UA Negative    Ketones, UA Negative    Spec Grav, UA 1.010 1.010 - 1.025   Blood, UA Negative    pH, UA 6.0 5.0 - 8.0   Protein, UA Negative Negative   Urobilinogen, UA 0.2 0.2 or 1.0 E.U./dL   Nitrite, UA Negative    Leukocytes, UA Negative Negative  IFOBT POC (occult bld, rslt in office)  Result Value Ref Range   IFOBT Negative     Assessment & Plan    Routine Health Maintenance and Physical Exam  Exercise Activities and Dietary recommendations Goals   None     Immunization History  Administered Date(s) Administered  . Influenza Inj Mdck Quad With Preservative 02/09/2018  . Influenza Nasal 02/27/2008  . Influenza Split 02/14/2009, 02/12/2010, 01/20/2011  . Influenza,inj,Quad PF,6+ Mos 12/23/2016, 01/11/2019  . Influenza-Unspecified 02/01/2019  . Tdap 12/14/2007    Health Maintenance  Topic  Date Due  . COVID-19 Vaccine (1) Never done  . HIV Screening  Never done  . TETANUS/TDAP  12/13/2017  . INFLUENZA VACCINE  11/19/2019  . COLONOSCOPY  11/02/2022  . Hepatitis C Screening  Completed    Discussed health benefits of physical activity, and encouraged him to engage in regular exercise appropriate for his age and condition.  1. Annual physical exam Follow-up 1 year. - Lipid panel - TSH - CBC w/Diff/Platelet - Comprehensive Metabolic Panel (CMET)  2. Hypercholesterolemia without hypertriglyceridemia  - Lipid panel - TSH - CBC w/Diff/Platelet - Comprehensive Metabolic Panel (CMET)  3. Screening for thyroid disorder  - Lipid panel - TSH - CBC w/Diff/Platelet - Comprehensive Metabolic Panel (CMET)  4. Prostate cancer screening  - PSA  5. Screening for blood or protein in urine  - POCT urinalysis dipstick--Negative  6. Need for Tdap vaccination  - Tdap vaccine greater than or equal to 7yo IM  7. Encounter for screening fecal occult blood testing  - IFOBT POC (occult bld, rslt in office)--Negative   Return in about 1 year (around 03/26/2021).        Ife Vitelli Wendelyn Breslow, MD  [facility] [phone] (phone) [phone] (fax)  [facility]

## 2020-03-26 ENCOUNTER — Encounter: Payer: Self-pay | Admitting: Family Medicine

## 2020-03-26 ENCOUNTER — Ambulatory Visit (INDEPENDENT_AMBULATORY_CARE_PROVIDER_SITE_OTHER): Payer: BC Managed Care – PPO | Admitting: Family Medicine

## 2020-03-26 ENCOUNTER — Other Ambulatory Visit: Payer: Self-pay

## 2020-03-26 VITALS — BP 108/70 | HR 57 | Temp 98.7°F | Resp 16 | Ht 72.0 in | Wt 260.0 lb

## 2020-03-26 DIAGNOSIS — E7800 Pure hypercholesterolemia, unspecified: Secondary | ICD-10-CM | POA: Diagnosis not present

## 2020-03-26 DIAGNOSIS — Z125 Encounter for screening for malignant neoplasm of prostate: Secondary | ICD-10-CM | POA: Diagnosis not present

## 2020-03-26 DIAGNOSIS — Z1211 Encounter for screening for malignant neoplasm of colon: Secondary | ICD-10-CM

## 2020-03-26 DIAGNOSIS — Z1389 Encounter for screening for other disorder: Secondary | ICD-10-CM | POA: Diagnosis not present

## 2020-03-26 DIAGNOSIS — Z0000 Encounter for general adult medical examination without abnormal findings: Secondary | ICD-10-CM | POA: Diagnosis not present

## 2020-03-26 DIAGNOSIS — Z23 Encounter for immunization: Secondary | ICD-10-CM

## 2020-03-26 DIAGNOSIS — Z1329 Encounter for screening for other suspected endocrine disorder: Secondary | ICD-10-CM | POA: Diagnosis not present

## 2020-03-26 LAB — POCT URINALYSIS DIPSTICK
Bilirubin, UA: NEGATIVE
Blood, UA: NEGATIVE
Glucose, UA: NEGATIVE
Ketones, UA: NEGATIVE
Leukocytes, UA: NEGATIVE
Nitrite, UA: NEGATIVE
Protein, UA: NEGATIVE
Spec Grav, UA: 1.01 (ref 1.010–1.025)
Urobilinogen, UA: 0.2 E.U./dL
pH, UA: 6 (ref 5.0–8.0)

## 2020-03-26 LAB — IFOBT (OCCULT BLOOD): IFOBT: NEGATIVE

## 2020-03-27 LAB — COMPREHENSIVE METABOLIC PANEL
ALT: 25 IU/L (ref 0–44)
AST: 17 IU/L (ref 0–40)
Albumin/Globulin Ratio: 2.1 (ref 1.2–2.2)
Albumin: 4.5 g/dL (ref 3.8–4.9)
Alkaline Phosphatase: 73 IU/L (ref 44–121)
BUN/Creatinine Ratio: 12 (ref 9–20)
BUN: 12 mg/dL (ref 6–24)
Bilirubin Total: 0.9 mg/dL (ref 0.0–1.2)
CO2: 21 mmol/L (ref 20–29)
Calcium: 9.6 mg/dL (ref 8.7–10.2)
Chloride: 103 mmol/L (ref 96–106)
Creatinine, Ser: 1.02 mg/dL (ref 0.76–1.27)
GFR calc Af Amer: 93 mL/min/{1.73_m2} (ref >59)
GFR calc non Af Amer: 80 mL/min/{1.73_m2} (ref >59)
Globulin, Total: 2.1 g/dL (ref 1.5–4.5)
Glucose: 117 mg/dL — ABNORMAL HIGH (ref 65–99)
Potassium: 4.9 mmol/L (ref 3.5–5.2)
Sodium: 137 mmol/L (ref 134–144)
Total Protein: 6.6 g/dL (ref 6.0–8.5)

## 2020-03-27 LAB — CBC WITH DIFFERENTIAL/PLATELET
Basophils Absolute: 0.1 10*3/uL (ref 0.0–0.2)
Basos: 2 %
EOS (ABSOLUTE): 0.1 10*3/uL (ref 0.0–0.4)
Eos: 2 %
Hematocrit: 46.9 % (ref 37.5–51.0)
Hemoglobin: 16.7 g/dL (ref 13.0–17.7)
Immature Grans (Abs): 0.1 10*3/uL (ref 0.0–0.1)
Immature Granulocytes: 1 %
Lymphocytes Absolute: 1.9 10*3/uL (ref 0.7–3.1)
Lymphs: 31 %
MCH: 33.2 pg — ABNORMAL HIGH (ref 26.6–33.0)
MCHC: 35.6 g/dL (ref 31.5–35.7)
MCV: 93 fL (ref 79–97)
Monocytes Absolute: 0.5 10*3/uL (ref 0.1–0.9)
Monocytes: 9 %
Neutrophils Absolute: 3.3 10*3/uL (ref 1.4–7.0)
Neutrophils: 55 %
Platelets: 224 10*3/uL (ref 150–450)
RBC: 5.03 x10E6/uL (ref 4.14–5.80)
RDW: 12.7 % (ref 11.6–15.4)
WBC: 6 10*3/uL (ref 3.4–10.8)

## 2020-03-27 LAB — LIPID PANEL
Chol/HDL Ratio: 5.6 ratio — ABNORMAL HIGH (ref 0.0–5.0)
Cholesterol, Total: 207 mg/dL — ABNORMAL HIGH (ref 100–199)
HDL: 37 mg/dL — ABNORMAL LOW (ref >39)
LDL Chol Calc (NIH): 152 mg/dL — ABNORMAL HIGH (ref 0–99)
Triglycerides: 101 mg/dL (ref 0–149)
VLDL Cholesterol Cal: 18 mg/dL (ref 5–40)

## 2020-03-27 LAB — TSH: TSH: 2.37 u[IU]/mL (ref 0.450–4.500)

## 2020-03-27 LAB — PSA: Prostate Specific Ag, Serum: 3.7 ng/mL (ref 0.0–4.0)

## 2020-03-28 ENCOUNTER — Telehealth: Payer: Self-pay

## 2020-03-28 NOTE — Telephone Encounter (Signed)
-----   Message from Maple Hudson., MD sent at 03/28/2020  7:53 AM EST ----- Labs stable, prediabetic so work on diet and exercise with the weight loss

## 2020-03-28 NOTE — Telephone Encounter (Signed)
Pt advised.   Thanks,   -Nyriah Coote  

## 2020-08-26 ENCOUNTER — Encounter: Payer: Self-pay | Admitting: Family Medicine

## 2020-08-26 ENCOUNTER — Ambulatory Visit (INDEPENDENT_AMBULATORY_CARE_PROVIDER_SITE_OTHER): Payer: BC Managed Care – PPO | Admitting: Family Medicine

## 2020-08-26 ENCOUNTER — Other Ambulatory Visit: Payer: Self-pay

## 2020-08-26 VITALS — BP 115/69 | HR 55 | Temp 98.4°F | Resp 16 | Wt 263.0 lb

## 2020-08-26 DIAGNOSIS — H6501 Acute serous otitis media, right ear: Secondary | ICD-10-CM

## 2020-08-26 DIAGNOSIS — G4733 Obstructive sleep apnea (adult) (pediatric): Secondary | ICD-10-CM

## 2020-08-26 DIAGNOSIS — J301 Allergic rhinitis due to pollen: Secondary | ICD-10-CM

## 2020-08-26 MED ORDER — FLUTICASONE PROPIONATE 50 MCG/ACT NA SUSP: 2.0000 | Freq: Every day | NASAL | 2 refills | Status: DC

## 2020-08-26 MED ORDER — PREDNISONE 20 MG PO TABS: 20.0000 mg | ORAL_TABLET | Freq: Every day | ORAL | 0 refills | Status: DC

## 2020-08-26 NOTE — Progress Notes (Signed)
I,April Miller,acting as a scribe for Megan Mans, MD.,have documented all relevant documentation on the behalf of Megan Mans, MD,as directed by  Megan Mans, MD while in the presence of Megan Mans, MD.   Established patient visit   Patient: Walter Kim   DOB: 1960-09-10   60 y.o. Male  MRN: [id number] Visit Date: 08/26/2020  Today's healthcare provider: Megan Mans, MD   Chief Complaint  Patient presents with  . Ear Pain   Subjective    Otalgia  There is pain in the right ear. This is a new problem. The current episode started in the past 7 days (2 days). The problem occurs every few hours. The problem has been unchanged. There has been no fever. The pain is moderate. Associated symptoms include rhinorrhea. Pertinent negatives include no abdominal pain, coughing, diarrhea, ear discharge, headaches, hearing loss, neck pain, rash, sore throat or vomiting. He has tried NSAIDs for the symptoms. The treatment provided mild relief.    Patient has had sinus congestion for the past couple of weeks. Patient states congestion has mostly cleared up.  However his right ear began hurting 2 days ago. Pain is intermittent. No fever and no sore throat. Patient has been taking Aleve for the pain with mild relief.  Complaining of some right ear pain.      Medications: Outpatient Medications Prior to Visit  Medication Sig  . cholecalciferol (VITAMIN D) 1000 units tablet Take by mouth.   No facility-administered medications prior to visit.    Review of Systems  HENT: Positive for ear pain and rhinorrhea. Negative for ear discharge, hearing loss and sore throat.   Respiratory: Negative for cough.   Gastrointestinal: Negative for abdominal pain, diarrhea and vomiting.  Musculoskeletal: Negative for neck pain.  Skin: Negative for rash.  Neurological: Negative for headaches.        Objective    BP 115/69 (BP Location: Right Arm, Patient Position:  Sitting, Cuff Size: Large)   Pulse (!) 55   Temp 98.4 F (36.9 C) (Oral)   Resp 16   Wt 263 lb (119.3 kg)   SpO2 95%   BMI 35.67 kg/m  BP Readings from Last 3 Encounters:  08/26/20 115/69  03/26/20 108/70  03/23/19 100/64   Wt Readings from Last 3 Encounters:  08/26/20 263 lb (119.3 kg)  03/26/20 260 lb (117.9 kg)  03/23/19 230 lb 3.2 oz (104.4 kg)       Physical Exam Vitals reviewed.  Constitutional:      General: He is not in acute distress.    Appearance: He is well-developed.  HENT:     Head: Normocephalic and atraumatic.     Right Ear: Tympanic membrane and external ear normal.     Left Ear: Tympanic membrane and external ear normal.     Ears:     Comments: Right TM is full compared to the left.    Nose: Nose normal.     Mouth/Throat:     Pharynx: No oropharyngeal exudate.  Eyes:     General: No scleral icterus.    Conjunctiva/sclera: Conjunctivae normal.     Pupils: Pupils are equal, round, and reactive to light.  Neck:     Thyroid: No thyromegaly.  Cardiovascular:     Rate and Rhythm: Normal rate and regular rhythm.     Heart sounds: Normal heart sounds. No murmur heard.   Pulmonary:     Effort: Pulmonary effort is normal. No  respiratory distress.     Breath sounds: No wheezing.  Musculoskeletal:     Cervical back: Neck supple.  Lymphadenopathy:     Cervical: No cervical adenopathy.  Skin:    General: Skin is warm and dry.     Findings: No rash.  Neurological:     Mental Status: He is alert and oriented to person, place, and time.  Psychiatric:        Mood and Affect: Mood normal.        Behavior: Behavior normal.        Thought Content: Thought content normal.        Judgment: Judgment normal.       No results found for any visits on 08/26/20.  Assessment & Plan     1. Non-recurrent acute serous otitis media of right ear  - predniSONE (DELTASONE) 20 MG tablet; Take 1 tablet (20 mg total) by mouth daily with breakfast.  Dispense: 5  tablet; Refill: 0 - fluticasone (FLONASE) 50 MCG/ACT nasal spray; Place 2 sprays into both nostrils daily.  Dispense: 18.2 mL; Refill: 2 2.  Allergic rhinitis  No follow-ups on file.         Mikenzi Raysor Wendelyn Breslow, MD  [facility] [phone] (phone) [phone] (fax)  [facility]

## 2020-09-10 ENCOUNTER — Ambulatory Visit: Payer: BC Managed Care – PPO | Admitting: Family Medicine

## 2020-10-13 DIAGNOSIS — Z20822 Contact with and (suspected) exposure to covid-19: Secondary | ICD-10-CM | POA: Diagnosis not present

## 2020-11-12 ENCOUNTER — Ambulatory Visit: Payer: BC Managed Care – PPO | Admitting: Family Medicine

## 2020-11-12 DIAGNOSIS — H1033 Unspecified acute conjunctivitis, bilateral: Secondary | ICD-10-CM | POA: Diagnosis not present

## 2021-04-01 ENCOUNTER — Encounter: Payer: Self-pay | Admitting: Family Medicine

## 2021-07-29 ENCOUNTER — Ambulatory Visit (INDEPENDENT_AMBULATORY_CARE_PROVIDER_SITE_OTHER): Payer: BC Managed Care – PPO | Admitting: Family Medicine

## 2021-07-29 ENCOUNTER — Encounter: Payer: Self-pay | Admitting: Family Medicine

## 2021-07-29 VITALS — BP 107/68 | HR 62 | Temp 98.4°F | Resp 16 | Ht 72.0 in | Wt 268.0 lb

## 2021-07-29 DIAGNOSIS — Z0000 Encounter for general adult medical examination without abnormal findings: Secondary | ICD-10-CM

## 2021-07-29 DIAGNOSIS — G4733 Obstructive sleep apnea (adult) (pediatric): Secondary | ICD-10-CM

## 2021-07-29 DIAGNOSIS — Z125 Encounter for screening for malignant neoplasm of prostate: Secondary | ICD-10-CM | POA: Diagnosis not present

## 2021-07-29 DIAGNOSIS — E7800 Pure hypercholesterolemia, unspecified: Secondary | ICD-10-CM

## 2021-07-29 DIAGNOSIS — J301 Allergic rhinitis due to pollen: Secondary | ICD-10-CM | POA: Diagnosis not present

## 2021-07-29 DIAGNOSIS — Z1389 Encounter for screening for other disorder: Secondary | ICD-10-CM

## 2021-07-29 DIAGNOSIS — Z1329 Encounter for screening for other suspected endocrine disorder: Secondary | ICD-10-CM

## 2021-07-29 NOTE — Progress Notes (Signed)
? ? ? ?Complete physical exam ? ?I,April Miller,acting as a scribe for Megan Mans, MD.,have documented all relevant documentation on the behalf of Megan Mans, MD,as directed by  Megan Mans, MD while in the presence of Megan Mans, MD. ? ? ?Patient: Walter Kim   DOB: Mar 16, 1961   61 y.o. Male  MRN: [id number] ?Visit Date: 07/29/2021 ? ?Today's healthcare provider: Megan Mans, MD  ? ?Chief Complaint  ?Patient presents with  ? Annual Exam  ? ?Subjective  ?  ?Walter Kim is a 61 y.o. male who presents today for a complete physical exam.  ?He reports consuming a general diet. Gym/ health club routine includes 3 times a week. He generally feels fairly well. He reports sleeping well. He does not have additional problems to discuss today.  ?He is divorced with a son and a daughter.  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 ?HPI  ? ? ?Past Medical History:  ?Diagnosis Date  ? Anemia   ? after 06-23-11 surgery  ? Arthritis   ? PONV (postoperative nausea and vomiting)   ? after back surgery and last hip surgery  06-23-2011  ? Sleep apnea   ? stopbang=4  ? ?Past Surgical History:  ?Procedure Laterality Date  ? ACHILLES TENDON SURGERY Right 05/07/2016  ? Procedure: ACHILLES TENDON REPAIR;  Surgeon: Gwyneth Revels, DPM;  Location: [facility] ORS;  Service: Podiatry;  Laterality: Right;  ? BACK SURGERY    ? 20 yrs. ago  ? JOINT REPLACEMENT    ? PARTIAL KNEE ARTHROPLASTY  09/22/2011  ? Procedure: UNICOMPARTMENTAL KNEE;  Surgeon: [name], MD;  Location: [facility];  Service: Orthopedics;  Laterality: Right;  Right Medial Unicompartmental Knee  ? right knee arthroscopy  4 yrs. ago  ? SEPTOPLASTY    ? [facility]  ? TONSILLECTOMY    ? as child  ? TOTAL HIP ARTHROPLASTY  06/23/2011  ? Procedure: TOTAL HIP ARTHROPLASTY ANTERIOR APPROACH;  Surgeon: [name], MD;  Location: [facility];  Service: Orthopedics;  Laterality: Left;  ? ?Social History  ? ?Socioeconomic History  ? Marital status:  Legally Separated  ?  Spouse name: Lawson Fiscal  ? Number of children: 2  ? Years of education: associates  ? Highest education level: Not on file  ?Occupational History  ? Occupation: Self employed  ?Tobacco Use  ? Smoking status: Never  ? Smokeless tobacco: Never  ?Vaping Use  ? Vaping Use: Never used  ?Substance and Sexual Activity  ? Alcohol use: Yes  ?  Alcohol/week: 2.0 standard drinks  ?  Types: 2 Cans of beer per week  ?  Comment: monthly  ? Drug use: No  ? Sexual activity: Not on file  ?Other Topics Concern  ? Not on file  ?Social History Narrative  ? Not on file  ? ?Social Determinants of Health  ? ?Financial Resource Strain: Not on file  ?Food Insecurity: Not on file  ?Transportation Needs: Not on file  ?Physical Activity: Not on file  ?Stress: Not on file  ?Social Connections: Not on file  ?Intimate Partner Violence: Not on file  ? ?Family Status  ?Relation Name Status  ? Mother  Deceased  ? Father  Deceased  ? Brother  Alive  ? ?Family History  ?Problem Relation Age of Onset  ? Emphysema Mother   ? Emphysema Father   ? Heart disease Brother   ? ?No Known Allergies  ?Patient Care Team: ?Maple Hudson.,  MD as PCP - General (Family Medicine)  ? ?Medications: ?Outpatient Medications Prior to Visit  ?Medication Sig  ? cholecalciferol (VITAMIN D) 1000 units tablet Take by mouth.  ? [DISCONTINUED] fluticasone (FLONASE) 50 MCG/ACT nasal spray Place 2 sprays into both nostrils daily. (Patient not taking: Reported on 07/29/2021)  ? [DISCONTINUED] Influenza Vac Subunit Quad (FLUCELVAX QUADRIVALENT) SUSP injection Flucelvax Quad 2019-2020 60 mcg (15 mcg x 4)/0.5 mL intramuscular susp  ? [DISCONTINUED] predniSONE (DELTASONE) 20 MG tablet Take 1 tablet (20 mg total) by mouth daily with breakfast. (Patient not taking: Reported on 07/29/2021)  ? ?No facility-administered medications prior to visit.  ? ? ?Review of Systems  ?HENT:  Positive for sinus pressure and sneezing.   ?Musculoskeletal:  Positive for arthralgias.   ?All other systems reviewed and are negative. ? ?Last lipids ?Lab Results  ?Component Value Date  ? CHOL 216 (H) 07/29/2021  ? HDL 40 07/29/2021  ? LDLCALC 151 (H) 07/29/2021  ? TRIG 140 07/29/2021  ? CHOLHDL 5.4 (H) 07/29/2021  ? ?  ? Objective  ?  ?BP 107/68 (BP Location: Left Arm, Patient Position: Sitting, Cuff Size: Large)   Pulse 62   Temp 98.4 ?F (36.9 ?C) (Temporal)   Wt 268 lb (121.6 kg)   BMI 36.35 kg/m?  ?BP Readings from Last 3 Encounters:  ?07/29/21 107/68  ?08/26/20 115/69  ?03/26/20 108/70  ? ?Wt Readings from Last 3 Encounters:  ?07/29/21 268 lb (121.6 kg)  ?08/26/20 263 lb (119.3 kg)  ?03/26/20 260 lb (117.9 kg)  ? ?  ? ? ?Physical Exam ?Vitals reviewed.  ?Constitutional:   ?   Appearance: He is well-developed.  ?HENT:  ?   Head: Normocephalic and atraumatic.  ?   Right Ear: External ear normal.  ?   Left Ear: External ear normal.  ?   Nose: Nose normal.  ?Eyes:  ?   Conjunctiva/sclera: Conjunctivae normal.  ?   Pupils: Pupils are equal, round, and reactive to light.  ?Cardiovascular:  ?   Rate and Rhythm: Normal rate and regular rhythm.  ?   Heart sounds: Normal heart sounds.  ?Pulmonary:  ?   Effort: Pulmonary effort is normal.  ?   Breath sounds: Normal breath sounds.  ?Abdominal:  ?   General: Bowel sounds are normal.  ?   Palpations: Abdomen is soft.  ?Genitourinary: ?   Penis: Normal.   ?   Testes: Normal.  ?Musculoskeletal:     ?   General: Normal range of motion.  ?   Cervical back: Normal range of motion and neck supple.  ?Skin: ?   General: Skin is warm and dry.  ?   Comments: Patient has a fever blister that is healing on his lip.  ?Neurological:  ?   General: No focal deficit present.  ?   Mental Status: He is alert and oriented to person, place, and time.  ?Psychiatric:     ?   Mood and Affect: Mood normal.     ?   Behavior: Behavior normal.     ?   Thought Content: Thought content normal.     ?   Judgment: Judgment normal.  ?  ? ? ?Last depression screening scores ? ?  03/26/2020   ?  8:30 AM 03/23/2019  ?  9:33 AM 03/21/2018  ? 10:10 AM  ?PHQ 2/9 Scores  ?PHQ - 2 Score 0 1 1  ?PHQ- 9 Score 2 2   ? ?Last fall risk screening ? ?  03/23/2019  ?  9:33 AM  ?Fall Risk   ?Falls in the past year? 0  ?Number falls in past yr: 0  ?Injury with Fall? 0  ?Follow up Falls evaluation completed  ? ?Last Audit-C alcohol use screening ? ?  03/26/2020  ?  8:29 AM  ?Alcohol Use Disorder Test (AUDIT)  ?1. How often do you have a drink containing alcohol? 2  ?2. How many drinks containing alcohol do you have on a typical day when you are drinking? 0  ?3. How often do you have six or more drinks on one occasion? 0  ?AUDIT-C Score 2  ?Alcohol Brief Interventions/Follow-up AUDIT Score <7 follow-up not indicated  ? ?A score of 3 or more in women, and 4 or more in men indicates increased risk for alcohol abuse, EXCEPT if all of the points are from question 1  ? ?No results found for any visits on 07/29/21. ? Assessment & Plan  ?  ?Routine Health Maintenance and Physical Exam ? ?Exercise Activities and Dietary recommendations ? Goals   ?None ?  ? ? ?Immunization History  ?Administered Date(s) Administered  ? Influenza Inj Mdck Quad With Preservative 02/09/2018  ? Influenza Nasal 02/27/2008  ? Influenza Split 02/14/2009, 02/12/2010, 01/20/2011  ? Influenza,inj,Quad PF,6+ Mos 12/23/2016, 01/11/2019  ? Influenza-Unspecified 02/01/2019  ? Tdap 12/14/2007, 03/26/2020  ? ? ?Health Maintenance  ?Topic Date Due  ? COVID-19 Vaccine (1) Never done  ? HIV Screening  Never done  ? Zoster Vaccines- Shingrix (1 of 2) Never done  ? INFLUENZA VACCINE  11/18/2021  ? COLONOSCOPY (Pts 45-20yrs Insurance coverage will need to be confirmed)  11/02/2022  ? TETANUS/TDAP  03/26/2030  ? Hepatitis C Screening  Completed  ? HPV VACCINES  Aged Out  ? ? ?Discussed health benefits of physical activity, and encouraged him to engage in regular exercise appropriate for his age and condition. ? ?1. Annual physical exam ?Good lifestyle with diet and  exercise stressed. ?- Lipid panel ?- CBC w/Diff/Platelet ?- Comprehensive Metabolic Panel (CMET) ?- TSH ? ?2. Hypercholesterolemia without hypertriglyceridemia ? ?- Lipid panel ?- CBC w/Diff/Platelet ?- Comprehensi

## 2021-07-30 LAB — CBC WITH DIFFERENTIAL/PLATELET
Basophils Absolute: 0.1 10*3/uL (ref 0.0–0.2)
Basos: 1 %
EOS (ABSOLUTE): 0.2 10*3/uL (ref 0.0–0.4)
Eos: 2 %
Hematocrit: 52.1 % — ABNORMAL HIGH (ref 37.5–51.0)
Hemoglobin: 17.7 g/dL (ref 13.0–17.7)
Immature Grans (Abs): 0.1 10*3/uL (ref 0.0–0.1)
Immature Granulocytes: 1 %
Lymphocytes Absolute: 2.8 10*3/uL (ref 0.7–3.1)
Lymphs: 28 %
MCH: 32.1 pg (ref 26.6–33.0)
MCHC: 34 g/dL (ref 31.5–35.7)
MCV: 95 fL (ref 79–97)
Monocytes Absolute: 0.8 10*3/uL (ref 0.1–0.9)
Monocytes: 8 %
Neutrophils Absolute: 6.3 10*3/uL (ref 1.4–7.0)
Neutrophils: 60 %
Platelets: 230 10*3/uL (ref 150–450)
RBC: 5.51 x10E6/uL (ref 4.14–5.80)
RDW: 13.4 % (ref 11.6–15.4)
WBC: 10.2 10*3/uL (ref 3.4–10.8)

## 2021-07-30 LAB — COMPREHENSIVE METABOLIC PANEL
ALT: 33 IU/L (ref 0–44)
AST: 24 IU/L (ref 0–40)
Albumin/Globulin Ratio: 2.3 — ABNORMAL HIGH (ref 1.2–2.2)
Albumin: 4.5 g/dL (ref 3.8–4.8)
Alkaline Phosphatase: 72 IU/L (ref 44–121)
BUN/Creatinine Ratio: 12 (ref 10–24)
BUN: 12 mg/dL (ref 8–27)
Bilirubin Total: 0.9 mg/dL (ref 0.0–1.2)
CO2: 21 mmol/L (ref 20–29)
Calcium: 9.3 mg/dL (ref 8.6–10.2)
Chloride: 101 mmol/L (ref 96–106)
Creatinine, Ser: 1 mg/dL (ref 0.76–1.27)
Globulin, Total: 2 g/dL (ref 1.5–4.5)
Glucose: 112 mg/dL — ABNORMAL HIGH (ref 70–99)
Potassium: 4.9 mmol/L (ref 3.5–5.2)
Sodium: 137 mmol/L (ref 134–144)
Total Protein: 6.5 g/dL (ref 6.0–8.5)
eGFR: 86 mL/min/{1.73_m2} (ref >59)

## 2021-07-30 LAB — LIPID PANEL
Chol/HDL Ratio: 5.4 ratio — ABNORMAL HIGH (ref 0.0–5.0)
Cholesterol, Total: 216 mg/dL — ABNORMAL HIGH (ref 100–199)
HDL: 40 mg/dL (ref >39)
LDL Chol Calc (NIH): 151 mg/dL — ABNORMAL HIGH (ref 0–99)
Triglycerides: 140 mg/dL (ref 0–149)
VLDL Cholesterol Cal: 25 mg/dL (ref 5–40)

## 2021-07-30 LAB — PSA: Prostate Specific Ag, Serum: 2.5 ng/mL (ref 0.0–4.0)

## 2021-08-18 ENCOUNTER — Ambulatory Visit
Admission: RE | Admit: 2021-08-18 | Discharge: 2021-08-18 | Disposition: A | Discharge status: Home / Self Care | Payer: BC Managed Care – PPO | Source: Ambulatory Visit | Attending: Family Medicine | Admitting: Family Medicine

## 2021-08-18 ENCOUNTER — Ambulatory Visit
Admission: RE | Admit: 2021-08-18 | Discharge: 2021-08-18 | Disposition: A | Discharge status: Home / Self Care | Payer: BC Managed Care – PPO | Attending: Family Medicine | Admitting: Family Medicine

## 2021-08-18 ENCOUNTER — Ambulatory Visit: Payer: BC Managed Care – PPO | Admitting: Family Medicine

## 2021-08-18 VITALS — BP 110/75 | HR 71 | Temp 98.5°F | Resp 97 | Wt 261.0 lb

## 2021-08-18 DIAGNOSIS — R051 Acute cough: Secondary | ICD-10-CM

## 2021-08-18 DIAGNOSIS — J189 Pneumonia, unspecified organism: Secondary | ICD-10-CM

## 2021-08-18 DIAGNOSIS — R059 Cough, unspecified: Secondary | ICD-10-CM | POA: Diagnosis not present

## 2021-08-18 MED ORDER — PREDNISONE 20 MG PO TABS: 20.0000 mg | ORAL_TABLET | Freq: Every day | ORAL | 1 refills | Status: AC

## 2021-08-18 MED ORDER — AZITHROMYCIN 250 MG PO TABS
ORAL_TABLET | ORAL | 0 refills | Status: AC
Start: 2021-08-18 — End: 2021-08-23

## 2021-08-18 NOTE — Progress Notes (Signed)
?  ? ? ?Established patient visit ? ? ?Patient: Walter Kim   DOB: [birth year]   61 y.o. Male  MRN: [id number] ?Visit Date: 08/18/2021 ? ?Today's healthcare provider: Wilhemena Durie, MD  ? ?No chief complaint on file. ? ?Subjective  ?  ?HPI  ?Patient is a 61 year old male who presents for evaluation of cough and congestion symptoms. He states he began having symptoms of head congestion 2 weeks ago.  Prior to this past weekend he felt the symptoms were moving into his chest.  Friday and Saturday he was running fever, had body aches and cough.  His fever broke on Saturday/Sunday and he has not felt feverish since.  He states he is having bad cough that is productive of yellow sputum.  He has been using Sudafed allergy cold and  Mucinex.   ?He denies any shortness of breath but at times admits it has been difficult to take a deep breath.   He states that he has had a little chest pain associated with when he is coughing. ?Has had a second worsening of his symptoms with a temperature up to 102 a couple of days ago and the cough becoming productive of greenish sputum.  He feels worse than he did.  He did have a negative COVID test. ?Medications: ?Outpatient Medications Prior to Visit  ?Medication Sig  ? cholecalciferol (VITAMIN D) 1000 units tablet Take by mouth.  ? ?No facility-administered medications prior to visit.  ? ? ?Review of Systems  ?Constitutional:  Positive for fatigue and fever.  ?HENT:  Positive for congestion and postnasal drip. Negative for ear discharge, ear pain, rhinorrhea, sinus pressure, sinus pain, sore throat and trouble swallowing.   ?Eyes:  Negative for photophobia, pain, discharge, redness, itching and visual disturbance.  ?Respiratory:  Negative for shortness of breath and wheezing.   ?Cardiovascular:  Negative for chest pain, palpitations and leg swelling.  ?Musculoskeletal:  Positive for arthralgias.  ?Neurological:  Negative for dizziness, light-headedness and headaches.  ? ?Last  metabolic panel ?Lab Results  ?Component Value Date  ? GLUCOSE 112 (H) 07/29/2021  ? NA 137 07/29/2021  ? K 4.9 07/29/2021  ? CL 101 07/29/2021  ? CO2 21 07/29/2021  ? BUN 12 07/29/2021  ? CREATININE 1.00 07/29/2021  ? EGFR 86 07/29/2021  ? CALCIUM 9.3 07/29/2021  ? PROT 6.5 07/29/2021  ? ALBUMIN 4.5 07/29/2021  ? LABGLOB 2.0 07/29/2021  ? AGRATIO 2.3 (H) 07/29/2021  ? BILITOT 0.9 07/29/2021  ? ALKPHOS 72 07/29/2021  ? AST 24 07/29/2021  ? ALT 33 07/29/2021  ? ?  ?  Objective  ?  ?BP 110/75 (BP Location: Left Arm, Patient Position: Sitting, Cuff Size: Large)   Pulse 71   Temp 98.5 ?F (36.9 ?C) (Oral)   Resp (!) 97   Wt 261 lb (118.4 kg)   BMI 35.40 kg/m?  ?BP Readings from Last 3 Encounters:  ?08/18/21 110/75  ?07/29/21 107/68  ?08/26/20 115/69  ? ?Wt Readings from Last 3 Encounters:  ?08/18/21 261 lb (118.4 kg)  ?07/29/21 268 lb (121.6 kg)  ?08/26/20 263 lb (119.3 kg)  ? ?  ? ?Physical Exam ?Vitals reviewed.  ?Constitutional:   ?   General: He is not in acute distress. ?   Appearance: He is well-developed.  ?HENT:  ?   Head: Normocephalic and atraumatic.  ?   Right Ear: Hearing, tympanic membrane and external ear normal.  ?   Left Ear: Hearing, tympanic membrane and external ear  normal.  ?   Nose: Nose normal.  ?Eyes:  ?   General: Lids are normal. No scleral icterus.    ?   Right eye: No discharge.     ?   Left eye: No discharge.  ?   Conjunctiva/sclera: Conjunctivae normal.  ?Cardiovascular:  ?   Rate and Rhythm: Normal rate and regular rhythm.  ?   Heart sounds: Normal heart sounds.  ?Pulmonary:  ?   Effort: Pulmonary effort is normal. No respiratory distress.  ?Musculoskeletal:  ?   Cervical back: Neck supple.  ?Skin: ?   General: Skin is warm and dry.  ?   Findings: No lesion or rash.  ?Neurological:  ?   General: No focal deficit present.  ?   Mental Status: He is alert and oriented to person, place, and time.  ?Psychiatric:     ?   Mood and Affect: Mood normal.     ?   Speech: Speech normal.     ?    Behavior: Behavior normal.     ?   Thought Content: Thought content normal.     ?   Judgment: Judgment normal.  ?  ? ? ?No results found for any visits on 08/18/21. ? Assessment & Plan  ?  ? ?1. Acute cough ?Prednisone for 5 days ?- DG Chest 2 View; Future ? ?2. Walking pneumonia ?Z-Pak and chest x-ray. ?- DG Chest 2 View; Future ? ? ?No follow-ups on file.  ?   ? ?I, Wilhemena Durie, MD, have reviewed all documentation for this visit. The documentation on 08/20/21 for the exam, diagnosis, procedures, and orders are all accurate and complete. ? ? ? ?Gian Ybarra Cranford Mon, MD  ?[facility] ?[phone] (phone) ?[phone] (fax) ? ?[facility] ?

## 2022-01-13 DIAGNOSIS — D0461 Carcinoma in situ of skin of right upper limb, including shoulder: Secondary | ICD-10-CM | POA: Diagnosis not present

## 2022-01-13 DIAGNOSIS — D485 Neoplasm of uncertain behavior of skin: Secondary | ICD-10-CM | POA: Diagnosis not present

## 2022-02-11 DIAGNOSIS — L578 Other skin changes due to chronic exposure to nonionizing radiation: Secondary | ICD-10-CM | POA: Diagnosis not present

## 2022-02-11 DIAGNOSIS — D0461 Carcinoma in situ of skin of right upper limb, including shoulder: Secondary | ICD-10-CM | POA: Diagnosis not present

## 2022-02-11 DIAGNOSIS — L814 Other melanin hyperpigmentation: Secondary | ICD-10-CM | POA: Diagnosis not present

## 2022-02-11 DIAGNOSIS — L988 Other specified disorders of the skin and subcutaneous tissue: Secondary | ICD-10-CM | POA: Diagnosis not present

## 2022-08-04 ENCOUNTER — Encounter: Payer: BC Managed Care – PPO | Admitting: Family Medicine

## 2022-08-04 DIAGNOSIS — Z1389 Encounter for screening for other disorder: Secondary | ICD-10-CM | POA: Diagnosis not present

## 2022-08-04 DIAGNOSIS — G629 Polyneuropathy, unspecified: Secondary | ICD-10-CM | POA: Diagnosis not present

## 2022-08-04 DIAGNOSIS — Z0000 Encounter for general adult medical examination without abnormal findings: Secondary | ICD-10-CM | POA: Diagnosis not present

## 2022-08-04 DIAGNOSIS — E538 Deficiency of other specified B group vitamins: Secondary | ICD-10-CM | POA: Diagnosis not present

## 2022-08-04 DIAGNOSIS — E7800 Pure hypercholesterolemia, unspecified: Secondary | ICD-10-CM | POA: Diagnosis not present

## 2022-08-04 DIAGNOSIS — Z131 Encounter for screening for diabetes mellitus: Secondary | ICD-10-CM | POA: Diagnosis not present

## 2022-08-04 DIAGNOSIS — Z125 Encounter for screening for malignant neoplasm of prostate: Secondary | ICD-10-CM | POA: Diagnosis not present

## 2022-08-04 DIAGNOSIS — G4733 Obstructive sleep apnea (adult) (pediatric): Secondary | ICD-10-CM | POA: Diagnosis not present

## 2022-10-13 DIAGNOSIS — G4733 Obstructive sleep apnea (adult) (pediatric): Secondary | ICD-10-CM | POA: Diagnosis not present

## 2022-10-13 DIAGNOSIS — G629 Polyneuropathy, unspecified: Secondary | ICD-10-CM | POA: Diagnosis not present

## 2022-10-13 DIAGNOSIS — Z6835 Body mass index (BMI) 35.0-35.9, adult: Secondary | ICD-10-CM | POA: Diagnosis not present

## 2022-11-13 ENCOUNTER — Ambulatory Visit: Payer: BC Managed Care – PPO

## 2022-11-13 DIAGNOSIS — D120 Benign neoplasm of cecum: Secondary | ICD-10-CM | POA: Diagnosis not present

## 2022-11-13 DIAGNOSIS — Z1211 Encounter for screening for malignant neoplasm of colon: Secondary | ICD-10-CM | POA: Diagnosis not present

## 2022-11-13 DIAGNOSIS — D128 Benign neoplasm of rectum: Secondary | ICD-10-CM | POA: Diagnosis not present
